# Patient Record
Sex: Male | Born: 1990 | Race: White | Hispanic: No | Marital: Single | Smoking: Never smoker
Health system: Southern US, Community
[De-identification: ages and names within clinical notes are randomized; demographics above are authoritative.]

## PROBLEM LIST (undated history)

## (undated) DIAGNOSIS — K219 Gastro-esophageal reflux disease without esophagitis: Secondary | ICD-10-CM

## (undated) HISTORY — PX: APPENDECTOMY: SHX54

## (undated) HISTORY — DX: Gastro-esophageal reflux disease without esophagitis: K21.9

---

## 2019-03-18 ENCOUNTER — Inpatient Hospital Stay (HOSPITAL_COMMUNITY): Payer: Managed Care, Other (non HMO) | Admitting: Certified Registered Nurse Anesthetist

## 2019-03-18 ENCOUNTER — Encounter (HOSPITAL_BASED_OUTPATIENT_CLINIC_OR_DEPARTMENT_OTHER): Payer: Self-pay

## 2019-03-18 ENCOUNTER — Observation Stay (HOSPITAL_BASED_OUTPATIENT_CLINIC_OR_DEPARTMENT_OTHER)
Admission: EM | Admit: 2019-03-18 | Discharge: 2019-03-19 | Disposition: A | Discharge status: Home / Self Care | Payer: Managed Care, Other (non HMO) | Attending: Surgery | Admitting: Surgery

## 2019-03-18 ENCOUNTER — Encounter (HOSPITAL_COMMUNITY): Admission: EM | Disposition: A | Discharge status: Home / Self Care | Payer: Self-pay | Source: Home / Self Care

## 2019-03-18 ENCOUNTER — Emergency Department (HOSPITAL_BASED_OUTPATIENT_CLINIC_OR_DEPARTMENT_OTHER): Payer: Managed Care, Other (non HMO)

## 2019-03-18 ENCOUNTER — Other Ambulatory Visit: Payer: Self-pay

## 2019-03-18 DIAGNOSIS — K3580 Unspecified acute appendicitis: Secondary | ICD-10-CM | POA: Diagnosis present

## 2019-03-18 DIAGNOSIS — K3533 Acute appendicitis with perforation and localized peritonitis, with abscess: Principal | ICD-10-CM | POA: Insufficient documentation

## 2019-03-18 DIAGNOSIS — Z20828 Contact with and (suspected) exposure to other viral communicable diseases: Secondary | ICD-10-CM | POA: Insufficient documentation

## 2019-03-18 HISTORY — PX: LAPAROSCOPIC APPENDECTOMY: SHX408

## 2019-03-18 LAB — COMPREHENSIVE METABOLIC PANEL WITH GFR
ALT: 32 U/L (ref 0–44)
AST: 29 U/L (ref 15–41)
Albumin: 4.3 g/dL (ref 3.5–5.0)
Alkaline Phosphatase: 72 U/L (ref 38–126)
Anion gap: 10 (ref 5–15)
BUN: 14 mg/dL (ref 6–20)
CO2: 20 mmol/L — ABNORMAL LOW (ref 22–32)
Calcium: 9.5 mg/dL (ref 8.9–10.3)
Chloride: 109 mmol/L (ref 98–111)
Creatinine, Ser: 1.23 mg/dL (ref 0.61–1.24)
GFR calc Af Amer: >60 mL/min
GFR calc non Af Amer: >60 mL/min
Glucose, Bld: 122 mg/dL — ABNORMAL HIGH (ref 70–99)
Potassium: 3.6 mmol/L (ref 3.5–5.1)
Sodium: 139 mmol/L (ref 135–145)
Total Bilirubin: 1.3 mg/dL — ABNORMAL HIGH (ref 0.3–1.2)
Total Protein: 7.8 g/dL (ref 6.5–8.1)

## 2019-03-18 LAB — CBC WITH DIFFERENTIAL/PLATELET
Abs Immature Granulocytes: 0.06 10*3/uL (ref 0.00–0.07)
Basophils Absolute: 0.1 10*3/uL (ref 0.0–0.1)
Basophils Relative: 0 %
Eosinophils Absolute: 0 10*3/uL (ref 0.0–0.5)
Eosinophils Relative: 0 %
HCT: 50.7 % (ref 39.0–52.0)
Hemoglobin: 16.6 g/dL (ref 13.0–17.0)
Immature Granulocytes: 0 %
Lymphocytes Relative: 3 %
Lymphs Abs: 0.6 10*3/uL — ABNORMAL LOW (ref 0.7–4.0)
MCH: 26.9 pg (ref 26.0–34.0)
MCHC: 32.7 g/dL (ref 30.0–36.0)
MCV: 82.3 fL (ref 80.0–100.0)
Monocytes Absolute: 1.4 10*3/uL — ABNORMAL HIGH (ref 0.1–1.0)
Monocytes Relative: 8 %
Neutro Abs: 15 10*3/uL — ABNORMAL HIGH (ref 1.7–7.7)
Neutrophils Relative %: 89 %
Platelets: 353 10*3/uL (ref 150–400)
RBC: 6.16 MIL/uL — ABNORMAL HIGH (ref 4.22–5.81)
RDW: 12.9 % (ref 11.5–15.5)
WBC: 17 10*3/uL — ABNORMAL HIGH (ref 4.0–10.5)
nRBC: 0 % (ref 0.0–0.2)

## 2019-03-18 LAB — URINALYSIS, ROUTINE W REFLEX MICROSCOPIC
Glucose, UA: NEGATIVE mg/dL
Hgb urine dipstick: NEGATIVE
Ketones, ur: 15 mg/dL — AB
Leukocytes,Ua: NEGATIVE
Nitrite: NEGATIVE
Protein, ur: NEGATIVE mg/dL
Specific Gravity, Urine: 1.01 (ref 1.005–1.030)
pH: 6.5 (ref 5.0–8.0)

## 2019-03-18 LAB — LIPASE, BLOOD: Lipase: 19 U/L (ref 11–51)

## 2019-03-18 LAB — SARS CORONAVIRUS 2 BY RT PCR (HOSPITAL ORDER, PERFORMED IN ~~LOC~~ HOSPITAL LAB): SARS Coronavirus 2: NEGATIVE

## 2019-03-18 SURGERY — APPENDECTOMY, LAPAROSCOPIC
Anesthesia: General | Site: Abdomen

## 2019-03-18 MED ORDER — SODIUM CHLORIDE 0.9 % IV SOLN
2.0000 g | Freq: Once | INTRAVENOUS | Status: AC
  Administered 2019-03-18: 2 g via INTRAVENOUS
  Filled 2019-03-18: qty 20

## 2019-03-18 MED ORDER — OXYCODONE HCL 5 MG/5ML PO SOLN: 5.0000 mg | Freq: Once | ORAL | Status: DC | PRN

## 2019-03-18 MED ORDER — DEXAMETHASONE SODIUM PHOSPHATE 10 MG/ML IJ SOLN
INTRAMUSCULAR | Status: DC | PRN
  Administered 2019-03-18: 10 mg via INTRAVENOUS

## 2019-03-18 MED ORDER — SUGAMMADEX SODIUM 500 MG/5ML IV SOLN
INTRAVENOUS | Status: DC | PRN
  Administered 2019-03-18: 400 mg via INTRAVENOUS

## 2019-03-18 MED ORDER — ACETAMINOPHEN 500 MG PO TABS
ORAL_TABLET | ORAL | Status: AC
  Filled 2019-03-18: qty 2

## 2019-03-18 MED ORDER — IOHEXOL 300 MG/ML  SOLN
100.0000 mL | Freq: Once | INTRAMUSCULAR | Status: AC | PRN
  Administered 2019-03-18: 100 mL via INTRAVENOUS

## 2019-03-18 MED ORDER — ROCURONIUM BROMIDE 10 MG/ML (PF) SYRINGE
PREFILLED_SYRINGE | INTRAVENOUS | Status: AC
  Filled 2019-03-18: qty 10

## 2019-03-18 MED ORDER — DEXAMETHASONE SODIUM PHOSPHATE 10 MG/ML IJ SOLN
INTRAMUSCULAR | Status: AC
  Filled 2019-03-18: qty 1

## 2019-03-18 MED ORDER — PROPOFOL 10 MG/ML IV BOLUS
INTRAVENOUS | Status: DC | PRN
  Administered 2019-03-18: 200 mg via INTRAVENOUS

## 2019-03-18 MED ORDER — FENTANYL CITRATE (PF) 250 MCG/5ML IJ SOLN
INTRAMUSCULAR | Status: AC
  Filled 2019-03-18: qty 5

## 2019-03-18 MED ORDER — ONDANSETRON HCL 4 MG/2ML IJ SOLN
4.0000 mg | Freq: Once | INTRAMUSCULAR | Status: AC
  Administered 2019-03-18: 4 mg via INTRAVENOUS
  Filled 2019-03-18: qty 2

## 2019-03-18 MED ORDER — PHENYLEPHRINE 40 MCG/ML (10ML) SYRINGE FOR IV PUSH (FOR BLOOD PRESSURE SUPPORT)
PREFILLED_SYRINGE | INTRAVENOUS | Status: AC
  Filled 2019-03-18: qty 10

## 2019-03-18 MED ORDER — POTASSIUM CHLORIDE 2 MEQ/ML IV SOLN: INTRAVENOUS | Status: DC

## 2019-03-18 MED ORDER — LACTATED RINGERS IV SOLN
INTRAVENOUS | Status: DC
  Administered 2019-03-18 (×2): via INTRAVENOUS

## 2019-03-18 MED ORDER — LIDOCAINE 2% (20 MG/ML) 5 ML SYRINGE
INTRAMUSCULAR | Status: AC
  Filled 2019-03-18: qty 5

## 2019-03-18 MED ORDER — OXYCODONE HCL 5 MG PO TABS
5.0000 mg | ORAL_TABLET | ORAL | Status: DC | PRN
  Administered 2019-03-18 – 2019-03-19 (×2): 5 mg via ORAL
  Administered 2019-03-19: 10 mg via ORAL
  Administered 2019-03-19: 5 mg via ORAL
  Filled 2019-03-18: qty 2
  Filled 2019-03-18 (×2): qty 1
  Filled 2019-03-18: qty 2

## 2019-03-18 MED ORDER — MORPHINE SULFATE (PF) 4 MG/ML IV SOLN
1.0000 mg | INTRAVENOUS | Status: DC | PRN
  Administered 2019-03-18: 2 mg via INTRAVENOUS
  Filled 2019-03-18: qty 1

## 2019-03-18 MED ORDER — SODIUM CHLORIDE 0.9 % IV BOLUS
1000.0000 mL | Freq: Once | INTRAVENOUS | Status: AC
  Administered 2019-03-18: 1000 mL via INTRAVENOUS

## 2019-03-18 MED ORDER — BUPIVACAINE-EPINEPHRINE 0.25% -1:200000 IJ SOLN
INTRAMUSCULAR | Status: DC | PRN
  Administered 2019-03-18: 30 mL

## 2019-03-18 MED ORDER — MORPHINE SULFATE (PF) 4 MG/ML IV SOLN
6.0000 mg | Freq: Once | INTRAVENOUS | Status: AC
  Administered 2019-03-18: 6 mg via INTRAVENOUS
  Filled 2019-03-18: qty 2

## 2019-03-18 MED ORDER — LIDOCAINE 2% (20 MG/ML) 5 ML SYRINGE
INTRAMUSCULAR | Status: DC | PRN
  Administered 2019-03-18: 100 mg via INTRAVENOUS

## 2019-03-18 MED ORDER — BUPIVACAINE-EPINEPHRINE 0.25% -1:200000 IJ SOLN
INTRAMUSCULAR | Status: AC
  Filled 2019-03-18: qty 1

## 2019-03-18 MED ORDER — SUCCINYLCHOLINE CHLORIDE 200 MG/10ML IV SOSY
PREFILLED_SYRINGE | INTRAVENOUS | Status: DC | PRN
  Administered 2019-03-18: 160 mg via INTRAVENOUS

## 2019-03-18 MED ORDER — ONDANSETRON HCL 4 MG/2ML IJ SOLN
INTRAMUSCULAR | Status: DC | PRN
  Administered 2019-03-18: 4 mg via INTRAVENOUS

## 2019-03-18 MED ORDER — PIPERACILLIN-TAZOBACTAM 3.375 G IVPB
3.3750 g | Freq: Three times a day (TID) | INTRAVENOUS | Status: DC
  Administered 2019-03-18 – 2019-03-19 (×3): 3.375 g via INTRAVENOUS
  Filled 2019-03-18 (×3): qty 50

## 2019-03-18 MED ORDER — SUCCINYLCHOLINE CHLORIDE 200 MG/10ML IV SOSY
PREFILLED_SYRINGE | INTRAVENOUS | Status: AC
  Filled 2019-03-18: qty 10

## 2019-03-18 MED ORDER — LACTATED RINGERS IR SOLN
Status: DC | PRN
  Administered 2019-03-18: 1000 mL

## 2019-03-18 MED ORDER — ACETAMINOPHEN 500 MG PO TABS
1000.0000 mg | ORAL_TABLET | Freq: Four times a day (QID) | ORAL | Status: DC
  Administered 2019-03-18 – 2019-03-19 (×5): 1000 mg via ORAL
  Filled 2019-03-18 (×4): qty 2

## 2019-03-18 MED ORDER — ONDANSETRON HCL 4 MG/2ML IJ SOLN: 4.0000 mg | INTRAMUSCULAR | Status: DC | PRN

## 2019-03-18 MED ORDER — MIDAZOLAM HCL 5 MG/5ML IJ SOLN
INTRAMUSCULAR | Status: DC | PRN
  Administered 2019-03-18: 2 mg via INTRAVENOUS

## 2019-03-18 MED ORDER — ONDANSETRON HCL 4 MG/2ML IJ SOLN
INTRAMUSCULAR | Status: AC
  Filled 2019-03-18: qty 2

## 2019-03-18 MED ORDER — OXYCODONE HCL 5 MG PO TABS: 5.0000 mg | ORAL_TABLET | Freq: Once | ORAL | Status: DC | PRN

## 2019-03-18 MED ORDER — FENTANYL CITRATE (PF) 100 MCG/2ML IJ SOLN
INTRAMUSCULAR | Status: DC | PRN
  Administered 2019-03-18 (×5): 50 ug via INTRAVENOUS

## 2019-03-18 MED ORDER — MIDAZOLAM HCL 2 MG/2ML IJ SOLN
INTRAMUSCULAR | Status: AC
  Filled 2019-03-18: qty 2

## 2019-03-18 MED ORDER — PHENYLEPHRINE 40 MCG/ML (10ML) SYRINGE FOR IV PUSH (FOR BLOOD PRESSURE SUPPORT)
PREFILLED_SYRINGE | INTRAVENOUS | Status: DC | PRN
  Administered 2019-03-18: 80 ug via INTRAVENOUS

## 2019-03-18 MED ORDER — KCL IN DEXTROSE-NACL 20-5-0.45 MEQ/L-%-% IV SOLN
INTRAVENOUS | Status: DC
  Administered 2019-03-18: 21:00:00 via INTRAVENOUS
  Filled 2019-03-18 (×3): qty 1000

## 2019-03-18 MED ORDER — SODIUM CHLORIDE 0.9 % IV SOLN: Freq: Once | INTRAVENOUS | Status: DC

## 2019-03-18 MED ORDER — ONDANSETRON HCL 4 MG/2ML IJ SOLN: 4.0000 mg | Freq: Once | INTRAMUSCULAR | Status: DC | PRN

## 2019-03-18 MED ORDER — FENTANYL CITRATE (PF) 100 MCG/2ML IJ SOLN: 25.0000 ug | INTRAMUSCULAR | Status: DC | PRN

## 2019-03-18 MED ORDER — PROPOFOL 10 MG/ML IV BOLUS
INTRAVENOUS | Status: AC
  Filled 2019-03-18: qty 20

## 2019-03-18 MED ORDER — ROCURONIUM BROMIDE 50 MG/5ML IV SOSY
PREFILLED_SYRINGE | INTRAVENOUS | Status: DC | PRN
  Administered 2019-03-18: 10 mg via INTRAVENOUS
  Administered 2019-03-18: 50 mg via INTRAVENOUS
  Administered 2019-03-18: 20 mg via INTRAVENOUS

## 2019-03-18 MED ORDER — METRONIDAZOLE IN NACL 5-0.79 MG/ML-% IV SOLN
500.0000 mg | Freq: Once | INTRAVENOUS | Status: AC
  Administered 2019-03-18: 500 mg via INTRAVENOUS
  Filled 2019-03-18: qty 100

## 2019-03-18 MED ORDER — SUGAMMADEX SODIUM 500 MG/5ML IV SOLN
INTRAVENOUS | Status: AC
  Filled 2019-03-18: qty 5

## 2019-03-18 SURGICAL SUPPLY — 44 items
APPLIER CLIP ROT 10 11.4 M/L (STAPLE)
BENZOIN TINCTURE PRP APPL 2/3 (GAUZE/BANDAGES/DRESSINGS) IMPLANT
CABLE HIGH FREQUENCY MONO STRZ (ELECTRODE) IMPLANT
CHLORAPREP W/TINT 26 (MISCELLANEOUS) ×3 IMPLANT
CLIP APPLIE ROT 10 11.4 M/L (STAPLE) IMPLANT
CLOSURE WOUND 1/2 X4 (GAUZE/BANDAGES/DRESSINGS)
COVER SURGICAL LIGHT HANDLE (MISCELLANEOUS) ×3 IMPLANT
COVER WAND RF STERILE (DRAPES) IMPLANT
CUTTER FLEX LINEAR 45M (STAPLE) ×6 IMPLANT
DECANTER SPIKE VIAL GLASS SM (MISCELLANEOUS) ×3 IMPLANT
DERMABOND ADVANCED (GAUZE/BANDAGES/DRESSINGS) ×2
DERMABOND ADVANCED .7 DNX12 (GAUZE/BANDAGES/DRESSINGS) ×1 IMPLANT
ELECT REM PT RETURN 15FT ADLT (MISCELLANEOUS) ×3 IMPLANT
ENDOLOOP SUT PDS II  0 18 (SUTURE)
ENDOLOOP SUT PDS II 0 18 (SUTURE) IMPLANT
GLOVE SURG SYN 7.5  E (GLOVE) ×2
GLOVE SURG SYN 7.5 E (GLOVE) ×1 IMPLANT
GOWN STRL REUS W/TWL XL LVL3 (GOWN DISPOSABLE) ×6 IMPLANT
GRASPER SUT TROCAR 14GX15 (MISCELLANEOUS) ×3 IMPLANT
KIT BASIN OR (CUSTOM PROCEDURE TRAY) ×3 IMPLANT
KIT TURNOVER KIT A (KITS) IMPLANT
PENCIL SMOKE EVACUATOR (MISCELLANEOUS) IMPLANT
POUCH RETRIEVAL ECOSAC 10 (ENDOMECHANICALS) ×1 IMPLANT
POUCH RETRIEVAL ECOSAC 10MM (ENDOMECHANICALS) ×2
POUCH SPECIMEN RETRIEVAL 10MM (ENDOMECHANICALS) IMPLANT
RELOAD 45 THICK GREEN (ENDOMECHANICALS) ×3 IMPLANT
RELOAD 45 VASCULAR/THIN (ENDOMECHANICALS) IMPLANT
RELOAD STAPLE TA45 3.5 REG BLU (ENDOMECHANICALS) IMPLANT
SCISSORS LAP 5X35 DISP (ENDOMECHANICALS) ×3 IMPLANT
SET IRRIG TUBING LAPAROSCOPIC (IRRIGATION / IRRIGATOR) ×3 IMPLANT
SET TUBE SMOKE EVAC HIGH FLOW (TUBING) ×3 IMPLANT
SHEARS HARMONIC ACE PLUS 36CM (ENDOMECHANICALS) ×3 IMPLANT
SHEARS HARMONIC ACE PLUS 45CM (MISCELLANEOUS) ×3 IMPLANT
SLEEVE XCEL OPT CAN 5 100 (ENDOMECHANICALS) ×3 IMPLANT
STRIP CLOSURE SKIN 1/2X4 (GAUZE/BANDAGES/DRESSINGS) IMPLANT
SUT MNCRL AB 4-0 PS2 18 (SUTURE) ×3 IMPLANT
SUT VIC AB 2-0 CT1 27 (SUTURE) ×2
SUT VIC AB 2-0 CT1 TAPERPNT 27 (SUTURE) ×1 IMPLANT
SUT VIC AB 2-0 SH 18 (SUTURE) IMPLANT
TOWEL OR 17X26 10 PK STRL BLUE (TOWEL DISPOSABLE) ×3 IMPLANT
TOWEL OR NON WOVEN STRL DISP B (DISPOSABLE) ×3 IMPLANT
TRAY LAPAROSCOPIC (CUSTOM PROCEDURE TRAY) ×3 IMPLANT
TROCAR BLADELESS OPT 5 100 (ENDOMECHANICALS) ×3 IMPLANT
TROCAR XCEL BLUNT TIP 100MML (ENDOMECHANICALS) ×3 IMPLANT

## 2019-03-18 NOTE — ED Triage Notes (Signed)
Pt states began with vomiting 4 days ago, not able to hold much down po, began having right sided abdominal pain 4am this morning.  Seen by pmd today, sent here for further eval/CT scan.

## 2019-03-18 NOTE — H&P (Addendum)
Jolene SchimkeJoshua Burbage 04-30-1990  [id number].    Chief Complaint/Reason for Consult: Acute appendicitis  HPI:  This is a 28 year old white male with no past medical history who began having intermittent nausea and vomiting on Monday.  By Tuesday this had resolved and then once again recurred on Wednesday.  It resolved again on Thursday but then returned this morning along with right sided abdominal pain.  He denies any diarrhea.  He denies any fevers at home but currently has a fever of 102 here in the emergency room.  Nothing made his pain better or worse.  Due to persistent pain he presented to the emergency department where he was evaluated and found to have acute appendicitis on the CT scan.  He was transferred from [facility].  ROS: ROS: Please see HPI otherwise all other systems have been reviewed and are negative.  He denies any Covid symptoms or Covid exposure.  Covid test is negative  History reviewed. No pertinent family history.  History reviewed. No pertinent past medical history.  History reviewed. No pertinent surgical history.  Social History:  reports that he has never smoked. He has never used smokeless tobacco. He reports current alcohol use. He reports current drug use. Drug: Marijuana.  Allergies: No Known Allergies  Medications Prior to Admission  Medication Sig Dispense Refill  . promethazine (PHENERGAN) 25 MG/ML injection Inject into the muscle.       Physical Exam: Blood pressure 126/88, pulse (!) 115, temperature (!) 102.6 F (39.2 C), temperature source Oral, resp. rate 18, height 5\' 11"  (1.803 m), weight 116.1 kg, SpO2 98 %. General: pleasant, WD, WN white male who is laying in bed in NAD HEENT: head is normocephalic, atraumatic.  Sclera are noninjected.  PERRL.  Ears and nose without any masses or lesions.  Mouth is pink and moist Heart: regular, rate, and rhythm.  Normal s1,s2. No obvious murmurs,  gallops, or rubs noted.  Palpable radial and pedal pulses bilaterally Lungs: CTAB, no wheezes, rhonchi, or rales noted.  Respiratory effort nonlabored Abd: soft, mild diffuse tenderness but greatest pain is in the right lower quadrant at McBurney's point., obese, hypoactive BS, no masses, hernias, or organomegaly MS: all 4 extremities are symmetrical with no cyanosis, clubbing, or edema. Skin: warm and dry with no masses, lesions, or rashes Psych: A&Ox3 with an appropriate affect.   Results for orders placed or performed during the hospital encounter of 03/18/19 (from the past 48 hour(s))  CBC with Differential     Status: Abnormal   Collection Time: 03/18/19 11:04 AM  Result Value Ref Range   WBC 17.0 (H) 4.0 - 10.5 K/uL   RBC 6.16 (H) 4.22 - 5.81 MIL/uL   Hemoglobin 16.6 13.0 - 17.0 g/dL   HCT 56.250.7 13.039.0 - 86.552.0 %   MCV 82.3 80.0 - 100.0 fL   MCH 26.9 26.0 - 34.0 pg   MCHC 32.7 30.0 - 36.0 g/dL   RDW 78.412.9 69.611.5 - 29.515.5 %   Platelets 353 150 - 400 K/uL   nRBC 0.0 0.0 - 0.2 %   Neutrophils Relative % 89 %   Neutro Abs 15.0 (H) 1.7 - 7.7 K/uL   Lymphocytes Relative 3 %   Lymphs Abs 0.6 (L) 0.7 - 4.0 K/uL   Monocytes Relative 8 %   Monocytes Absolute 1.4 (H) 0.1 - 1.0 K/uL   Eosinophils Relative 0 %   Eosinophils Absolute 0.0 0.0 - 0.5 K/uL  Basophils Relative 0 %   Basophils Absolute 0.1 0.0 - 0.1 K/uL   Immature Granulocytes 0 %   Abs Immature Granulocytes 0.06 0.00 - 0.07 K/uL    Comment: Performed at [facility], [address] [place], [place] [address]  Comprehensive metabolic panel     Status: Abnormal   Collection Time: 03/18/19 11:04 AM  Result Value Ref Range   Sodium 139 135 - 145 mmol/L   Potassium 3.6 3.5 - 5.1 mmol/L   Chloride 109 98 - 111 mmol/L   CO2 20 (L) 22 - 32 mmol/L   Glucose, Bld 122 (H) 70 - 99 mg/dL   BUN 14 6 - 20 mg/dL   Creatinine, Ser 1.23 0.61 - 1.24 mg/dL   Calcium 9.5 8.9 - 10.3 mg/dL   Total Protein 7.8 6.5 - 8.1 g/dL   Albumin  4.3 3.5 - 5.0 g/dL   AST 29 15 - 41 U/L   ALT 32 0 - 44 U/L   Alkaline Phosphatase 72 38 - 126 U/L   Total Bilirubin 1.3 (H) 0.3 - 1.2 mg/dL   GFR calc non Af Amer >60 >60 mL/min   GFR calc Af Amer >60 >60 mL/min   Anion gap 10 5 - 15    Comment: Performed at [facility], [address] [place], [place] [address]  Lipase, blood     Status: None   Collection Time: 03/18/19 11:04 AM  Result Value Ref Range   Lipase 19 11 - 51 U/L    Comment: Performed at [facility], [address] [place], [place] [address]  Urinalysis, Routine w reflex microscopic     Status: Abnormal   Collection Time: 03/18/19 11:24 AM  Result Value Ref Range   Color, Urine AMBER (A) YELLOW    Comment: BIOCHEMICALS MAY BE AFFECTED BY COLOR   APPearance CLEAR CLEAR   Specific Gravity, Urine 1.010 1.005 - 1.030   pH 6.5 5.0 - 8.0   Glucose, UA NEGATIVE NEGATIVE mg/dL   Hgb urine dipstick NEGATIVE NEGATIVE   Bilirubin Urine SMALL (A) NEGATIVE   Ketones, ur 15 (A) NEGATIVE mg/dL   Protein, ur NEGATIVE NEGATIVE mg/dL   Nitrite NEGATIVE NEGATIVE   Leukocytes,Ua NEGATIVE NEGATIVE    Comment: Microscopic not done on urines with negative protein, blood, leukocytes, nitrite, or glucose < 500 mg/dL. Performed at [facility], [address] [place], [place] [address]   SARS Coronavirus 2 by RT PCR (hospital order, performed in [facility] hospital lab) Nasopharyngeal Nasopharyngeal Swab     Status: None   Collection Time: 03/18/19 12:29 PM   Specimen: Nasopharyngeal Swab  Result Value Ref Range   SARS Coronavirus 2 NEGATIVE NEGATIVE    Comment: Performed at [facility], [address] [place], [place] [address]   Ct Abdomen Pelvis W Contrast  Result Date: 03/18/2019 CLINICAL DATA:  Vomiting beginning 4 days ago. Right-sided abdominal pain since 4 a.m. this morning EXAM: CT ABDOMEN AND PELVIS WITH CONTRAST TECHNIQUE: Multidetector CT imaging of the abdomen and pelvis  was performed using the standard protocol following bolus administration of intravenous contrast. CONTRAST:  170mL OMNIPAQUE IOHEXOL 300 MG/ML  SOLN COMPARISON:  None. FINDINGS: Lower chest: Lung bases essentially clear.  Heart normal in size. Hepatobiliary: No focal liver abnormality is seen. No gallstones, gallbladder wall thickening, or biliary dilatation. Pancreas: Unremarkable. No pancreatic ductal dilatation or surrounding inflammatory changes. Spleen: Normal in size  without focal abnormality. Adrenals/Urinary Tract: Adrenal glands are unremarkable. Kidneys are normal, without renal calculi, focal lesion, or hydronephrosis. Bladder is unremarkable. Stomach/Bowel: Appendix is dilated to 1 cm with wall thickening and adjacent inflammation. There is adjacent inflammatory stranding. No extraluminal air or adjacent fluid collection. Stomach is unremarkable. Small bowel and colon are normal in caliber. No wall thickening. No inflammation. Vascular/Lymphatic: No significant vascular findings are present. No enlarged abdominal or pelvic lymph nodes. Reproductive: Unremarkable Other: Small amount of pelvic free fluid.  No hernia. Musculoskeletal: Well-defined lucent lesion in the intertrochanteric region of the right proximal femur with a smooth thin sclerotic margin, consistent with bone cyst. No other bone lesions. No fracture or acute finding. IMPRESSION: 1. Acute appendicitis. No evidence appendiceal rupture or of a periappendiceal abscess. 2. No other acute abnormality. 3. 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 4. No other abnormalities. Electronically Signed   By: Amie Portland M.D.   On: 03/18/2019 11:30      Assessment/Plan Acute appendicitis The patient will be admitted and taken to the operating room tonight for laparoscopic appendectomy.  He has been given Rocephin and Flagyl in the emergency department.  He will be given Tylenol currently for his fever of 102.7.   The patient is otherwise been n.p.o. and is ready for surgical intervention. I have discussed the procedure and risks of appendectomy. The risks include but are not limited to bleeding, infection, wound problems, anesthesia, injury to intra-abdominal organs, possibility of postoperative ileus. He seems to understand and agrees with the plan.  I have also discussed the expected postoperative recovery course.  FEN - NPO VTE - hold until post op ID - Rocephin/Flagyl  Letha Cape, [facility] Surgery 03/18/2019, 4:10 PM Please see Amion for pager number during day hours 7:00am-4:30pm  Agree with above.  I discussed with the patient the indications and risks of appendiceal surgery.  The primary risks of appendiceal surgery include, but are not limited to, bleeding, infection, bowel surgery, and open surgery.  There is also the risk that the patient may have continued symptoms after surgery.  We discussed the typical post-operative recovery course. I tried to answer the patient's questions.  He is from [place].  I will talk to his father post op.  He is single.  He works for [employer] as a Nurse, adult.  Ovidio Kin, MD, [facility] Surgery Office phone:  [phone]

## 2019-03-18 NOTE — Anesthesia Preprocedure Evaluation (Addendum)
Anesthesia Evaluation  Patient identified by MRN, date of birth, ID band Patient awake    Reviewed: Allergy & Precautions, NPO status , Patient's Chart, lab work & pertinent test results  Airway Mallampati: II  TM Distance: >3 FB Neck ROM: Full    Dental  (+) Teeth Intact, Dental Advisory Given   Pulmonary    breath sounds clear to auscultation       Cardiovascular  Rhythm:Regular Rate:Normal     Neuro/Psych    GI/Hepatic   Endo/Other    Renal/GU      Musculoskeletal   Abdominal   Peds  Hematology   Anesthesia Other Findings   Reproductive/Obstetrics                            Anesthesia Physical Anesthesia Plan  ASA: II and emergent  Anesthesia Plan: General   Post-op Pain Management:    Induction: Rapid sequence and Cricoid pressure planned  PONV Risk Score and Plan: Ondansetron and Dexamethasone  Airway Management Planned: Oral ETT  Additional Equipment:   Intra-op Plan:   Post-operative Plan: Extubation in OR  Informed Consent: I have reviewed the patients History and Physical, chart, labs and discussed the procedure including the risks, benefits and alternatives for the proposed anesthesia with the patient or authorized representative who has indicated his/her understanding and acceptance.     Dental advisory given  Plan Discussed with: CRNA and Anesthesiologist  Anesthesia Plan Comments:         Anesthesia Quick Evaluation

## 2019-03-18 NOTE — Transfer of Care (Signed)
Immediate Anesthesia Transfer of Care Note  Patient: Nicholas Maynard  Procedure(s) Performed: APPENDECTOMY LAPAROSCOPIC (N/A Abdomen)  Patient Location: PACU  Anesthesia Type:General  Level of Consciousness: awake, alert  and patient cooperative  Airway & Oxygen Therapy: Patient Spontanous Breathing and Patient connected to face mask oxygen  Post-op Assessment: Report given to RN and Post -op Vital signs reviewed and stable  Post vital signs: Reviewed and stable  Last Vitals:  Vitals Value Taken Time  BP 141/86 03/18/19 1805  Temp    Pulse 108 03/18/19 1808  Resp    SpO2 97 % 03/18/19 1808  Vitals shown include unvalidated device data.  Last Pain:  Vitals:   03/18/19 1506  TempSrc:   PainSc: 3       Patients Stated Pain Goal: 3 (94/76/54 [id number])  Complications: No apparent anesthesia complications

## 2019-03-18 NOTE — Anesthesia Postprocedure Evaluation (Signed)
Anesthesia Post Note  Patient: Nicholas Maynard  Procedure(s) Performed: APPENDECTOMY LAPAROSCOPIC (N/A Abdomen)     Patient location during evaluation: PACU Anesthesia Type: General Level of consciousness: awake, oriented and sedated Pain management: pain level controlled Vital Signs Assessment: post-procedure vital signs reviewed and stable Respiratory status: spontaneous breathing, nonlabored ventilation, respiratory function stable and patient connected to nasal cannula oxygen Cardiovascular status: blood pressure returned to baseline and stable Postop Assessment: no apparent nausea or vomiting Anesthetic complications: no    Last Vitals:  Vitals:   03/18/19 1456 03/18/19 1805  BP: 126/88 (!) 141/86  Pulse: (!) 115 (!) 109  Resp: 18 15  Temp: (!) 39.2 C 37.6 C  SpO2: 98% 97%    Last Pain:  Vitals:   03/18/19 1506  TempSrc:   PainSc: 3                  Nicholas Maynard

## 2019-03-18 NOTE — ED Notes (Signed)
Report given to [facility] with short stay pre-op, at [facility]

## 2019-03-18 NOTE — ED Provider Notes (Signed)
[facility] EMERGENCY DEPARTMENT Provider Note   CSN: [id number] Arrival date & time: 03/18/19  1023     History   Chief Complaint Chief Complaint  Patient presents with   Abdominal Pain   Nausea    HPI Nicholas Maynard is a 28 y.o. male.     The history is provided by the patient.  Abdominal Pain Pain location:  Generalized (worse in RLQ) Pain quality: aching   Pain radiates to:  Does not radiate Pain severity:  Mild Onset quality:  Gradual Timing:  Constant Progression:  Worsening Chronicity:  New Context: not previous surgeries   Relieved by:  Nothing Worsened by:  Nothing Associated symptoms: nausea and vomiting   Associated symptoms: no chest pain, no chills, no cough, no dysuria, no fever, no hematuria, no shortness of breath and no sore throat   Risk factors: has not had multiple surgeries     History reviewed. No pertinent past medical history.  There are no active problems to display for this patient.       Home Medications    Prior to Admission medications   Medication Sig Start Date End Date Taking? Authorizing Provider  promethazine (PHENERGAN) 25 MG/ML injection Inject into the muscle. 03/18/19 03/18/19  [provider]    Family History History reviewed. No pertinent family history.  Social History Social History   Tobacco Use   Smoking status: Not on file  Substance Use Topics   Alcohol use: Not on file   Drug use: Not on file     Allergies   Patient has no known allergies.   Review of Systems Review of Systems  Constitutional: Negative for chills and fever.  HENT: Negative for ear pain and sore throat.   Eyes: Negative for pain and visual disturbance.  Respiratory: Negative for cough and shortness of breath.   Cardiovascular: Negative for chest pain and palpitations.  Gastrointestinal: Positive for abdominal pain, nausea and vomiting.  Genitourinary: Negative for dysuria and hematuria.   Musculoskeletal: Negative for arthralgias and back pain.  Skin: Negative for color change and rash.  Neurological: Negative for seizures and syncope.  All other systems reviewed and are negative.    Physical Exam Updated Vital Signs BP 140/89    Pulse (!) 104    Temp 99.4 F (37.4 C) (Oral)    Resp 18    Ht 5\' 11"  (1.803 m)    Wt 116.1 kg    SpO2 98%    BMI 35.70 kg/m   Physical Exam Vitals signs and nursing note reviewed.  Constitutional:      General: He is not in acute distress.    Appearance: He is well-developed. He is not ill-appearing.  HENT:     Head: Normocephalic and atraumatic.     Mouth/Throat:     Mouth: Mucous membranes are moist.  Eyes:     Extraocular Movements: Extraocular movements intact.     Conjunctiva/sclera: Conjunctivae normal.  Neck:     Musculoskeletal: Neck supple.  Cardiovascular:     Rate and Rhythm: Regular rhythm. Tachycardia present.     Heart sounds: Normal heart sounds. No murmur.  Pulmonary:     Effort: Pulmonary effort is normal. No respiratory distress.     Breath sounds: Normal breath sounds.  Abdominal:     Palpations: Abdomen is soft.     Tenderness: There is generalized abdominal tenderness and tenderness in the right lower quadrant. There is guarding and rebound. Positive signs include McBurney's sign. Negative  signs include Rovsing's sign and psoas sign.  Skin:    General: Skin is warm and dry.     Capillary Refill: Capillary refill takes less than 2 seconds.  Neurological:     General: No focal deficit present.     Mental Status: He is alert.  Psychiatric:        Mood and Affect: Mood normal.      ED Treatments / Results  Labs (all labs ordered are listed, but only abnormal results are displayed) Labs Reviewed  CBC WITH DIFFERENTIAL/PLATELET - Abnormal; Notable for the following components:      Result Value   WBC 17.0 (*)    RBC 6.16 (*)    Neutro Abs 15.0 (*)    Lymphs Abs 0.6 (*)    Monocytes Absolute 1.4 (*)     All other components within normal limits  COMPREHENSIVE METABOLIC PANEL - Abnormal; Notable for the following components:   CO2 20 (*)    Glucose, Bld 122 (*)    Total Bilirubin 1.3 (*)    All other components within normal limits  URINALYSIS, ROUTINE W REFLEX MICROSCOPIC - Abnormal; Notable for the following components:   Color, Urine AMBER (*)    Bilirubin Urine SMALL (*)    Ketones, ur 15 (*)    All other components within normal limits  SARS CORONAVIRUS 2 BY RT PCR (HOSPITAL ORDER, PERFORMED IN [facility] LAB)  LIPASE, BLOOD    EKG None  Radiology Ct Abdomen Pelvis W Contrast  Result Date: 03/18/2019 CLINICAL DATA:  Vomiting beginning 4 days ago. Right-sided abdominal pain since 4 a.m. this morning EXAM: CT ABDOMEN AND PELVIS WITH CONTRAST TECHNIQUE: Multidetector CT imaging of the abdomen and pelvis was performed using the standard protocol following bolus administration of intravenous contrast. CONTRAST:  100mL OMNIPAQUE IOHEXOL 300 MG/ML  SOLN COMPARISON:  None. FINDINGS: Lower chest: Lung bases essentially clear.  Heart normal in size. Hepatobiliary: No focal liver abnormality is seen. No gallstones, gallbladder wall thickening, or biliary dilatation. Pancreas: Unremarkable. No pancreatic ductal dilatation or surrounding inflammatory changes. Spleen: Normal in size without focal abnormality. Adrenals/Urinary Tract: Adrenal glands are unremarkable. Kidneys are normal, without renal calculi, focal lesion, or hydronephrosis. Bladder is unremarkable. Stomach/Bowel: Appendix is dilated to 1 cm with wall thickening and adjacent inflammation. There is adjacent inflammatory stranding. No extraluminal air or adjacent fluid collection. Stomach is unremarkable. Small bowel and colon are normal in caliber. No wall thickening. No inflammation. Vascular/Lymphatic: No significant vascular findings are present. No enlarged abdominal or pelvic lymph nodes. Reproductive: Unremarkable Other:  Small amount of pelvic free fluid.  No hernia. Musculoskeletal: Well-defined lucent lesion in the intertrochanteric region of the right proximal femur with a smooth thin sclerotic margin, consistent with bone cyst. No other bone lesions. No fracture or acute finding. IMPRESSION: 1. Acute appendicitis. No evidence appendiceal rupture or of a periappendiceal abscess. 2. No other acute abnormality. 3. 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 4. No other abnormalities. Electronically Signed   By: Amie Portlandavid  Ormond M.D.   On: 03/18/2019 11:30    Procedures Procedures (including critical care time)  Medications Ordered in ED Medications  0.9 %  sodium chloride infusion (has no administration in time range)  cefTRIAXone (ROCEPHIN) 2 g in sodium chloride 0.9 % 100 mL IVPB (has no administration in time range)  metroNIDAZOLE (FLAGYL) IVPB 500 mg (500 mg Intravenous New Bag/Given 03/18/19 1232)  morphine 4 MG/ML injection 6 mg (6 mg Intravenous  Given 03/18/19 1124)  sodium chloride 0.9 % bolus 1,000 mL (1,000 mLs Intravenous New Bag/Given 03/18/19 1109)  ondansetron (ZOFRAN) injection 4 mg (4 mg Intravenous Given 03/18/19 1124)  iohexol (OMNIPAQUE) 300 MG/ML solution 100 mL (100 mLs Intravenous Contrast Given 03/18/19 1112)     Initial Impression / Assessment and Plan / ED Course  I have reviewed the triage vital signs and the nursing notes.  Pertinent labs & imaging results that were available during my care of the patient were reviewed by me and considered in my medical decision making (see chart for details).        Nicholas Maynard is a 28 year old male with no significant medical history who presents to the ED with lower abdominal pain with nausea and vomiting.  Patient with overall unremarkable vitals.  Mild tachycardia.  Patient has had some vomiting intermittently over the last 4 days but began having right-sided abdominal pain early this morning.  Has tenderness  throughout on abdominal exam but mostly tender in the right lower quadrant.  Concern for appendicitis.  Possibly viral gastroenteritis.  Does not have any urinary symptoms.  Will get IV fluids, IV morphine, IV nausea medicine, basic labs and CT scan abdomen and pelvis.  Low concern for bowel obstruction.  No history abdominal surgeries.  Patient with acute appendicitis on CT scan.  Overall uncomplicated.  Has a white count of 17 but otherwise unremarkable labs.  Talked with Claiborne Billings, [place] from general surgery team.  They recommend transfer to [facility] emergency department and they will admit from there or take to the OR from the ED.  Dr. Zenia Resides was made aware of the patient being transferred to the emergency department at [facility].  They will page surgery upon arrival of the patient.  Rapid Covid has been ordered.  Patient hemodynamically stable throughout my care.  Was started on maintenance fluids.  Given IV Flagyl and IV Rocephin for surgical prophylaxis.  This chart was dictated using voice recognition software.  Despite best efforts to proofread,  errors can occur which can change the documentation meaning.    Final Clinical Impressions(s) / ED Diagnoses   Final diagnoses:  Acute appendicitis, unspecified acute appendicitis type    ED Discharge Orders    None       Lennice Sites, DO 03/18/19 1252

## 2019-03-18 NOTE — ED Notes (Signed)
Report given to Shannon RN with [facility] 

## 2019-03-18 NOTE — Op Note (Signed)
Re:   Nicholas Maynard DOB:   1990/06/15 MRN:   [id number]                   FACILITY:  [facility]  DATE OF PROCEDURE: 03/18/2019                              OPERATIVE REPORT  PREOPERATIVE DIAGNOSIS:  Appendicitis  POSTOPERATIVE DIAGNOSIS:  Acute focally ruptured  appendicitis.  PROCEDURE:  Laparoscopic appendectomy.  SURGEON:  Sandria Bales. Ezzard Standing, MD  ASSISTANT:  No first assistant.  ANESTHESIA:  General endotracheal.  Anesthesiologist: Kipp Brood, MD CRNA: Wynonia Sours, CRNA  ASA:  2 and emergent  ESTIMATED BLOOD LOSS:  Minimal.  DRAINS: none   SPECIMEN:   Appendix  COUNTS CORRECT:  YES  INDICATIONS FOR PROCEDURE: Nicholas Maynard is a 28 y.o. (DOB: Dec 01, 1990) white male whose primary care doctor is Patient, No Pcp Per and comes to the OR for an appendectomy.   I discussed with the patient, the indications and potential complications of appendiceal surgery.  The potential complications include, but are not limited to, bleeding, open surgery, bowel resection, and the possibility of another diagnosis.  OPERATIVE NOTE:  The patient underwent a general endotracheal anesthetic as supervised by Anesthesiologist: Kipp Brood, MD CRNA: Wynonia Sours, CRNA, General, in OR room #4.  The patient was given Rocephin and Flagyl prior to the beginning of the procedure and the abdomen was prepped with ChloraPrep.   A time-out was held and surgical checklist run.  An infraumbilical incision was made with sharp dissection carried down to the abdominal cavity.  An 12 mm Hasson trocar was inserted through the infraumbilical incision and into the peritoneal cavity.  A 30 degree 5 mm laparoscope was inserted through a 12 mm Hasson trocar and the Hasson trocar secured with a 0 Vicryl suture.  I placed a 5 mm trocar in the right upper quadrant and a 5 mm torcar in left lower quadrant and did abdominal exploration.    The right and left lobes of liver unremarkable.  Stomach was unremarkable.  The  pelvic organs were unremarkable.  I saw no other intra-abdominal abnormality.  The patient had appendicitis with the appendix located in the right lower quadrant.  The appendix was focally perforated with limited containation.  The mesentery of the appendix was divided with a Harmonic scalpel.  I got to the base of the appendix.  I then used a green load 45 mm Ethicon Endo-GIA stapler (the base of the appendix was thickened) and fired this across the base of the appendix.  I placed the appendix in Eco Sac bag and delivered the bag through the umbilical incision.  I irrigated the abdomen with 1,000 cc of saline.  After irrigating the abdomen, I then removed the trocars, in turn.  The umbilical port fascia was closed with 0 Vicryl suture.  I did a second 2-0 Vicryl with an endoclose.  I closed the skin each site with a 4-0 Monocryl suture and painted the wounds with DermaBond.  I then injected a total of 30 mL of 0.25% Marcaine at the incisions.  Sponge and needle count were correct at the end of the case.  The patient was transferred to the recovery room in good condition.  The patient tolerated the procedure well and it depends on the patient's post op clinical course as to when the patient could be discharged.   Onalee Hua  Lucia Gaskins, MD, [facility] Surgery Pager: [phone] Office phone:  [phone]

## 2019-03-18 NOTE — ED Notes (Signed)
Patient transported to CT 

## 2019-03-18 NOTE — ED Notes (Signed)
All valuables kept with pt, wallet, cellphone, jeans, shoes, shirt

## 2019-03-18 NOTE — Anesthesia Procedure Notes (Addendum)
Procedure Name: Intubation Date/Time: 03/18/2019 4:47 PM Performed by: West Pugh, CRNA Pre-anesthesia Checklist: Patient identified, Emergency Drugs available, Suction available, Patient being monitored and Timeout performed Patient Re-evaluated:Patient Re-evaluated prior to induction Oxygen Delivery Method: Circle system utilized Preoxygenation: Pre-oxygenation with 100% oxygen Induction Type: IV induction, Rapid sequence and Cricoid Pressure applied Laryngoscope Size: Mac and 3 Grade View: Grade I Tube type: Oral Tube size: 7.5 mm Number of attempts: 1 Airway Equipment and Method: Stylet Placement Confirmation: ETT inserted through vocal cords under direct vision,  positive ETCO2,  CO2 detector and breath sounds checked- equal and bilateral Secured at: 21 cm Tube secured with: Tape Dental Injury: Teeth and Oropharynx as per pre-operative assessment

## 2019-03-19 ENCOUNTER — Encounter (HOSPITAL_COMMUNITY): Payer: Self-pay | Admitting: Surgery

## 2019-03-19 LAB — CBC WITH DIFFERENTIAL/PLATELET
Abs Immature Granulocytes: 0.12 10*3/uL — ABNORMAL HIGH (ref 0.00–0.07)
Basophils Absolute: 0 10*3/uL (ref 0.0–0.1)
Basophils Relative: 0 %
Eosinophils Absolute: 0 10*3/uL (ref 0.0–0.5)
Eosinophils Relative: 0 %
HCT: 42.2 % (ref 39.0–52.0)
Hemoglobin: 13.3 g/dL (ref 13.0–17.0)
Immature Granulocytes: 1 %
Lymphocytes Relative: 5 %
Lymphs Abs: 0.9 10*3/uL (ref 0.7–4.0)
MCH: 27.1 pg (ref 26.0–34.0)
MCHC: 31.5 g/dL (ref 30.0–36.0)
MCV: 86.1 fL (ref 80.0–100.0)
Monocytes Absolute: 1.3 10*3/uL — ABNORMAL HIGH (ref 0.1–1.0)
Monocytes Relative: 7 %
Neutro Abs: 16.3 10*3/uL — ABNORMAL HIGH (ref 1.7–7.7)
Neutrophils Relative %: 87 %
Platelets: 279 10*3/uL (ref 150–400)
RBC: 4.9 MIL/uL (ref 4.22–5.81)
RDW: 13.2 % (ref 11.5–15.5)
WBC: 18.7 10*3/uL — ABNORMAL HIGH (ref 4.0–10.5)
nRBC: 0 % (ref 0.0–0.2)

## 2019-03-19 MED ORDER — LIP MEDEX EX OINT
TOPICAL_OINTMENT | CUTANEOUS | Status: DC | PRN
  Filled 2019-03-19: qty 7

## 2019-03-19 MED ORDER — ENOXAPARIN SODIUM 40 MG/0.4ML ~~LOC~~ SOLN
40.0000 mg | SUBCUTANEOUS | Status: DC
  Administered 2019-03-19: 40 mg via SUBCUTANEOUS
  Filled 2019-03-19: qty 0.4

## 2019-03-19 MED ORDER — OXYCODONE HCL 5 MG PO TABS: 5.0000 mg | ORAL_TABLET | Freq: Four times a day (QID) | ORAL | 0 refills | Status: DC | PRN

## 2019-03-19 MED ORDER — AMOXICILLIN-POT CLAVULANATE 875-125 MG PO TABS: 1.0000 | ORAL_TABLET | Freq: Two times a day (BID) | ORAL | 0 refills | Status: AC

## 2019-03-19 NOTE — Discharge Summary (Addendum)
[facility]/Trauma Discharge Summary   Patient ID: Nicholas Maynard MRN: [id number] DOB/AGE: [birth year] 28 y.o.  Admit date: 03/18/2019 Discharge date: 03/19/2019  Admitting Diagnosis: appendicitis  Discharge Diagnosis Patient Active Problem List   Diagnosis Date Noted  . Acute appendicitis 03/18/2019    Consultants none  Imaging: Ct Abdomen Pelvis W Contrast  Result Date: 03/18/2019 CLINICAL DATA:  Vomiting beginning 4 days ago. Right-sided abdominal pain since 4 a.m. this morning EXAM: CT ABDOMEN AND PELVIS WITH CONTRAST TECHNIQUE: Multidetector CT imaging of the abdomen and pelvis was performed using the standard protocol following bolus administration of intravenous contrast. CONTRAST:  OMNIPAQUE IOHEXOL 300 MG/ML  SOLN COMPARISON:  None. FINDINGS: Lower chest: Lung bases essentially clear.  Heart normal in size. Hepatobiliary: No focal liver abnormality is seen. No gallstones, gallbladder wall thickening, or biliary dilatation. Pancreas: Unremarkable. No pancreatic ductal dilatation or surrounding inflammatory changes. Spleen: Normal in size without focal abnormality. Adrenals/Urinary Tract: Adrenal glands are unremarkable. Kidneys are normal, without renal calculi, focal lesion, or hydronephrosis. Bladder is unremarkable. Stomach/Bowel: Appendix is dilated to 1 cm with wall thickening and adjacent inflammation. There is adjacent inflammatory stranding. No extraluminal air or adjacent fluid collection. Stomach is unremarkable. Small bowel and colon are normal in caliber. No wall thickening. No inflammation. Vascular/Lymphatic: No significant vascular findings are present. No enlarged abdominal or pelvic lymph nodes. Reproductive: Unremarkable Other: Small amount of pelvic free fluid.  No hernia. Musculoskeletal: Well-defined lucent lesion in the intertrochanteric region of the right proximal femur with a smooth thin sclerotic margin, consistent with bone cyst. No other  bone lesions. No fracture or acute finding. IMPRESSION: 1. Acute appendicitis. No evidence appendiceal rupture or of a periappendiceal abscess. 2. No other acute abnormality. 3. 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 4. No other abnormalities. Electronically Signed   By: Amie Portland M.D.   On: 03/18/2019 11:30    Procedures Dr. Ezzard Standing (03/18/19) - Laparoscopic Appendectomy  HPI: This is a 28 year old white male with no past medical history who began having intermittent nausea and vomiting on Monday.  By Tuesday this had resolved and then once again recurred on Wednesday.  It resolved again on Thursday but then returned this morning along with right sided abdominal pain.  He denies any diarrhea.  He denies any fevers at home but currently has a fever of 102 here in the emergency room.  Nothing made his pain better or worse.  Due to persistent pain he presented to the emergency department where he was evaluated and found to have acute appendicitis on the CT scan.  He was [facility].  Hospital Course:  Workup showed appendicitis.  Patient was admitted and underwent procedure listed above.  Tolerated procedure well and was transferred to the floor. Intraoperative findings were Acute focally ruptured  appendicitis.  Diet was advanced as tolerated.  On POD#1, the patient was voiding well, having flatus, tolerating diet, ambulating well, pain well controlled, vital signs stable, incisions c/d/i and felt stable for discharge home. 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 I informed pt that if he has any R hip pain or concerns to follow up with his PCP. I also discussed return precautions. Patient expressed understanding.   Patient will follow up as outlined below and knows to call with questions or  concerns.  Patient was discharged in good condition.  The [place] Substance controlled database was reviewed prior to prescribing narcotic pain medication to this patient.  Physical Exam: General:  Alert, NAD, pleasant, cooperative Cardio: RRR, S1 & S2 normal, no murmur, rubs, gallops Resp: Effort normal, lungs CTA bilaterally, no wheezes, rales, rhonchi Abd:  Soft, ND, normal bowel sounds, incisions with glue intact are well appearing and are without bleeding, drainage, or signs of infection. Mild ecchymosis of umbilical incision. Generalized TTP worse around umbilical incision. No peritonitis   Skin: warm and dry, no rashes noted  Allergies as of 03/19/2019   No Known Allergies     Medication List    STOP taking these medications   promethazine 25 MG/ML injection Commonly known as: PHENERGAN     TAKE these medications   amoxicillin-clavulanate 875-125 MG tablet Commonly known as: Augmentin Take 1 tablet by mouth every 12 (twelve) hours for 7 days.   oxyCODONE 5 MG immediate release tablet Commonly known as: Oxy IR/ROXICODONE Take 1 tablet (5 mg total) by mouth every 6 (six) hours as needed for moderate pain.       Follow-up [facility] Follow up on 04/06/2019.   Specialty: General Surgery Why: a provider will call you at [place]. please send a photo of your incisions to [email or web address] a few days prior. Please include your name and date of birth in the subject line. Contact information: [address] [place] [phone]       Mcarthur Rossetti, MD. Schedule an appointment as soon as possible for a visit in 1 month(s).   Specialty: Orthopedic Surgery Why: to discuss and for follow up regarding the cyst seen on CT in your right femur Contact information: [place] [address] [phone]            Signed: [facility] 03/19/2019, 9:21 AM Please see amion for pager for the following: M, T, W, & Friday 7:00am - 4:30pm Thursdays 7:00am -11:30am  Agree with above. He was also given information about his right femur cyst seen on CT scan.  Alphonsa Overall, MD, [facility] Office phone:  [phone]

## 2019-03-19 NOTE — Progress Notes (Addendum)
Patient has a diarrhea stool and stated "Janett Billow said it could be an issue if I have diarrhea". Ate 1/3 of his hamburger. Janett Billow notified of patient concern and states she will visit him. Donne Hazel, RN

## 2019-03-19 NOTE — Discharge Instructions (Signed)
LAPAROSCOPIC SURGERY: POST OP INSTRUCTIONS  1. DIET: Follow a light bland diet the first 24 hours after arrival home, such as soup, liquids, crackers, etc. Be sure to include lots of fluids daily. Avoid fast food or heavy meals as your are more likely to get nauseated. Eat a low fat the next few days after surgery.  2. Take your usually prescribed home medications unless otherwise directed. 3. PAIN CONTROL:  1. Pain is best controlled by a usual combination of three different methods TOGETHER:  1. Ice/Heat 2. Over the counter pain medication 3. Prescription pain medication 2. Most patients will experience some swelling and bruising around the incisions. Ice packs or heating pads (30-60 minutes up to 6 times a day) will help. Use ice for the first few days to help decrease swelling and bruising, then switch to heat to help relax tight/sore spots and speed recovery. Some people prefer to use ice alone, heat alone, alternating between ice & heat. Experiment to what works for you. Swelling and bruising can take several weeks to resolve.  3. It is helpful to take an over-the-counter pain medication regularly for the first few weeks. Choose one of the following that works best for you:  1. Naproxen (Aleve, etc) Two 220mg  tabs twice a day 2. Ibuprofen (Advil, etc) Three 200mg  tabs four times a day (every meal & bedtime) 3. Acetaminophen (Tylenol, etc) 500-650mg  four times a day (every meal & bedtime) 4. A prescription for pain medication (such as oxycodone, hydrocodone, etc) should be given to you upon discharge. Take your pain medication as prescribed.  1. If you are having problems/concerns with the prescription medicine (does not control pain, nausea, vomiting, rash, itching, etc), please call us [phone] to see if we need to switch you to a different pain medicine that will work better for you and/or control your side effect better. 2. If you need a refill on your pain medication, please  contact your pharmacy. They will contact our office to request authorization. Prescriptions will not be filled after 5 pm or on week-ends. 4. Avoid getting constipated. Between the surgery and the pain medications, it is common to experience some constipation. Increasing fluid intake and taking a fiber supplement (such as Metamucil, Citrucel, FiberCon, MiraLax, etc) 1-2 times a day regularly will usually help prevent this problem from occurring. A mild laxative (prune juice, Milk of Magnesia, MiraLax, etc) should be taken according to package directions if there are no bowel movements after 48 hours.  5. Watch out for diarrhea. If you have many loose bowel movements, simplify your diet to bland foods & liquids for a few days. Stop any stool softeners and decrease your fiber supplement. Switching to mild anti-diarrheal medications (Kayopectate, Pepto Bismol) can help. If this worsens or does not improve, please call us. 6. Wash / shower every day. You may shower over the dressings as they are waterproof. Continue to shower over incision(s) after the dressing is off. 7. Remove your waterproof bandages 5 days after surgery. You may leave the incision open to air. You may replace a dressing/Band-Aid to cover the incision for comfort if you wish.  8. ACTIVITIES as tolerated:  1. You may resume regular (light) daily activities beginning the next day--such as daily self-care, walking, climbing stairs--gradually increasing activities as tolerated. If you can walk 30 minutes without difficulty, it is safe to try more intense activity such as jogging, treadmill, bicycling, low-impact aerobics, swimming, etc. 2. Save the most intensive and strenuous activity  for last such as sit-ups, heavy lifting, contact sports, etc Refrain from any heavy lifting or straining until you are off narcotics for pain control.  3. DO NOT PUSH THROUGH PAIN. Let pain be your guide: If it hurts to do something, don't do it. Pain is your body  warning you to avoid that activity for another week until the pain goes down. 4. You may drive when you are no longer taking prescription pain medication, you can comfortably wear a seatbelt, and you can safely maneuver your car and apply brakes. 5. You may have sexual intercourse when it is comfortable.  9. FOLLOW UP in our office  1. Please call [facility] at [phone] to set up an appointment to see your surgeon in the office for a follow-up appointment approximately 2-3 weeks after your surgery. 2. Make sure that you call for this appointment the day you arrive home to insure a convenient appointment time.      10. IF YOU HAVE DISABILITY OR FAMILY LEAVE FORMS, BRING THEM TO THE               OFFICE FOR PROCESSING.   WHEN TO CALL us [phone]:  1. Poor pain control 2. Reactions / problems with new medications (rash/itching, nausea, etc)  3. Fever over 101.5 F (38.5 C) 4. Inability to urinate 5. Nausea and/or vomiting 6. Worsening swelling or bruising 7. Abdominal bloating, with no passing of gas and nausea 8. Increased pain, redness, or drainage from incisions  The clinic staff is available to answer your questions during regular business hours (8:30am-5pm). Please dont hesitate to call and ask to speak to one of our nurses for clinical concerns.  If you have a medical emergency, go to the nearest emergency room or call 911.  A surgeon from [facility] is always on call at the [facility], [place], [place], [place],  [address] ?  MAIN: [phone] ? TOLL FREE: [phone] ?  FAX [phone]  [email or web address]

## 2019-03-19 NOTE — Progress Notes (Signed)
Discharge and medication instructions reviewed with patient. Questions answered. Patient is taking an [place] home. Patient instructed that he cannot drive on the oxycodone her just received for pain and he agrees not to, stating his "car is at the other location" [facility]) . No prescriptions given to patient. Donne Hazel, RN

## 2019-03-20 ENCOUNTER — Other Ambulatory Visit: Payer: Self-pay | Admitting: Surgery

## 2019-03-20 MED ORDER — OXYCODONE HCL 5 MG PO TABS: 5.0000 mg | ORAL_TABLET | Freq: Four times a day (QID) | ORAL | 0 refills | Status: DC | PRN

## 2019-03-22 LAB — SURGICAL PATHOLOGY

## 2020-10-19 DIAGNOSIS — L723 Sebaceous cyst: Secondary | ICD-10-CM | POA: Insufficient documentation

## 2020-10-23 IMAGING — CT CT ABD-PELV W/ CM
2 of 4 series · 16 of 46 positions shown, 18 images · IV contrast (Omnipaque)
Comparison: None.

CLINICAL DATA: Vomiting beginning 4 days ago. Right-sided abdominal
pain since 4 a.m. this morning

EXAM:
CT ABDOMEN AND PELVIS WITH CONTRAST
TECHNIQUE: Multidetector CT imaging of the abdomen and pelvis was performed
using the standard protocol following bolus administration of
intravenous contrast.
CONTRAST:  100mL OMNIPAQUE IOHEXOL 300 MG/ML  SOLN

[Series 2: axial st · axial · 0.97mm/px · z∈[-586,-36]mm · 13 of 120 slices shown, 15 images]
[im 5/120  soft-tissue]
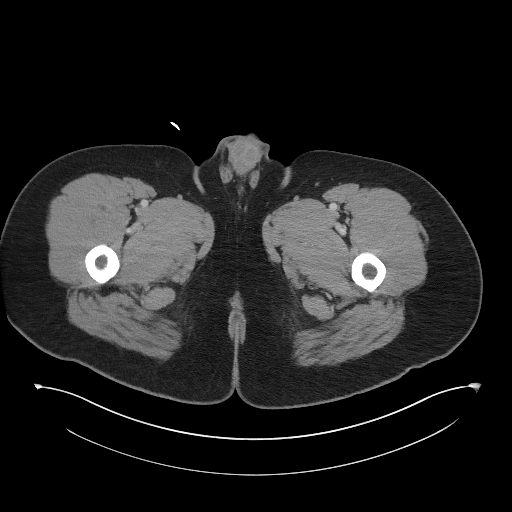
[im 5/120  bone]
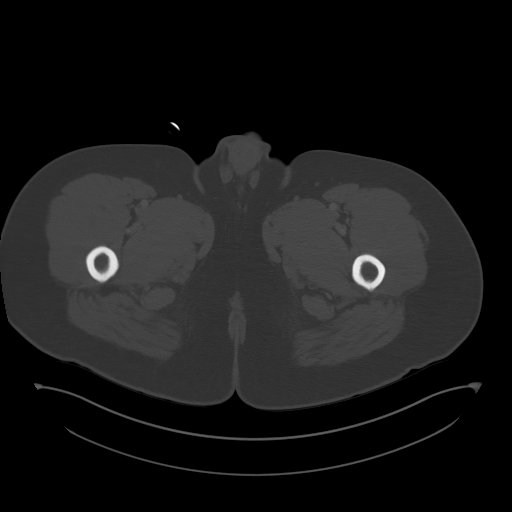
[im 15/120  soft-tissue]
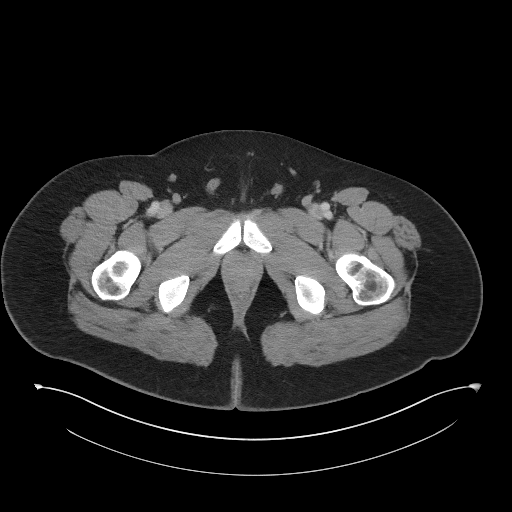
[im 25/120  soft-tissue]
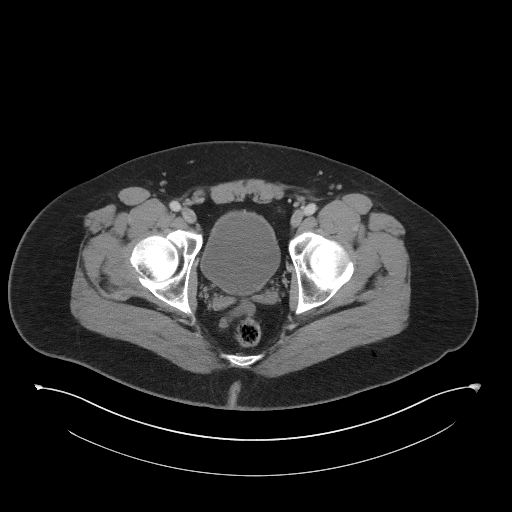
[im 35/120  soft-tissue]
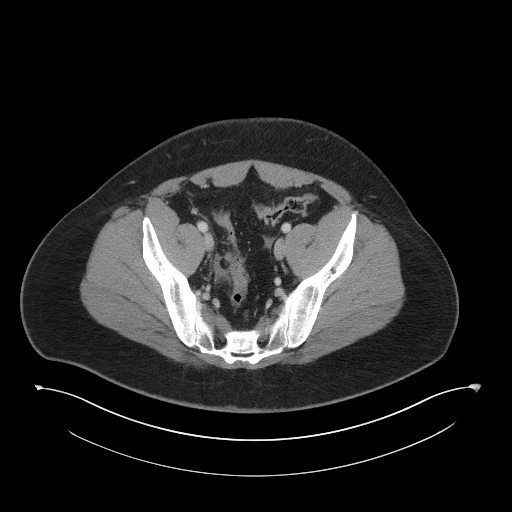
[im 40/120  soft-tissue]
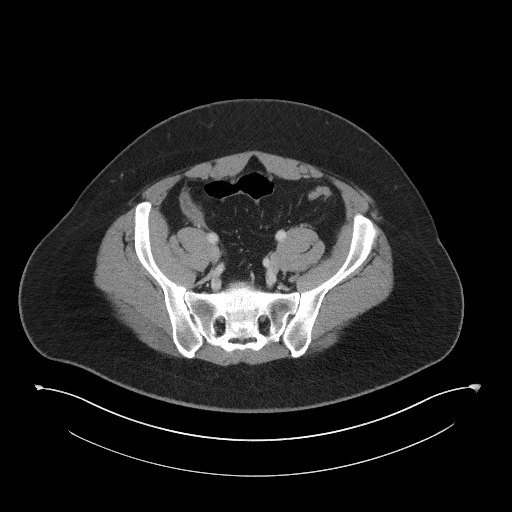
[im 50/120  soft-tissue]
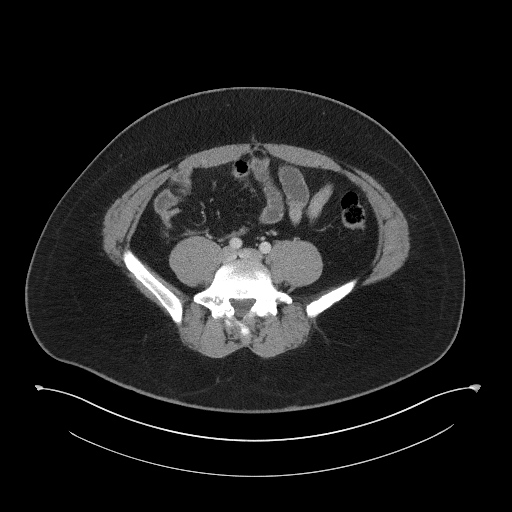
[im 60/120  soft-tissue]
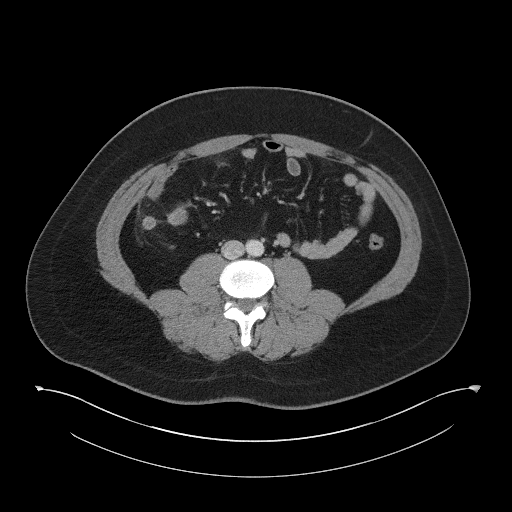
[im 70/120  soft-tissue]
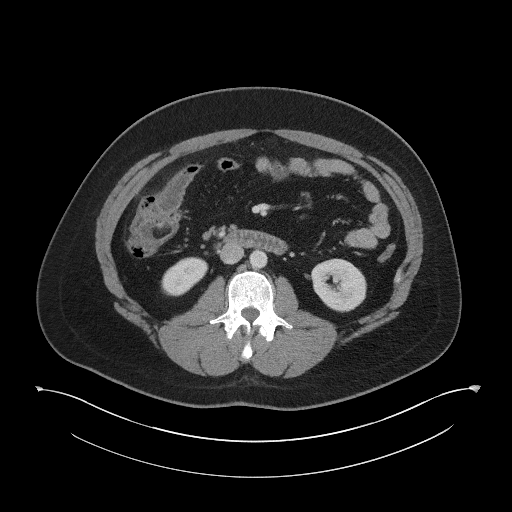
[im 80/120  soft-tissue]
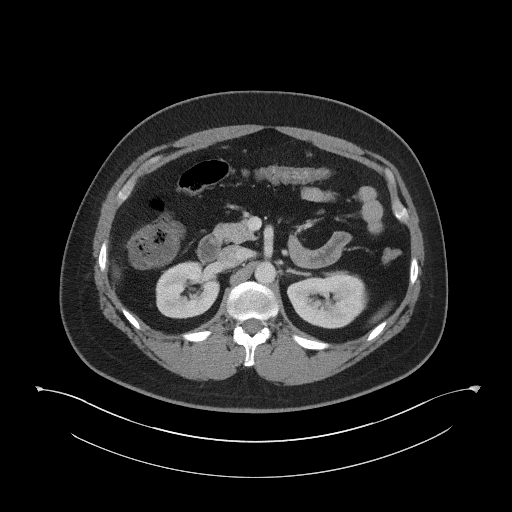
[im 80/120  bone]
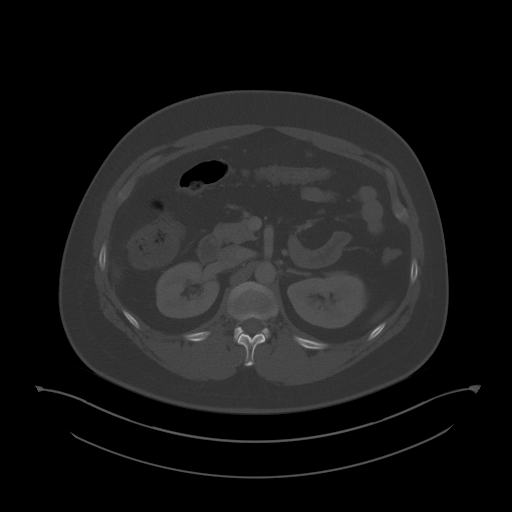
[im 85/120  soft-tissue]
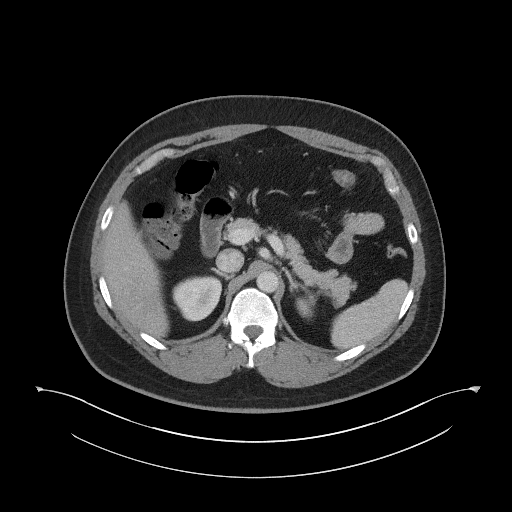
[im 95/120  soft-tissue]
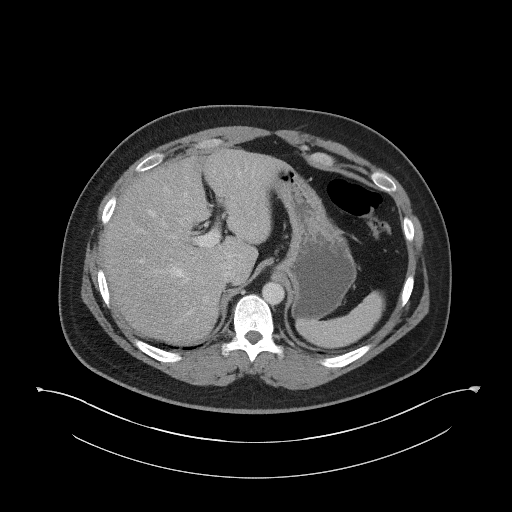
[im 105/120  soft-tissue]
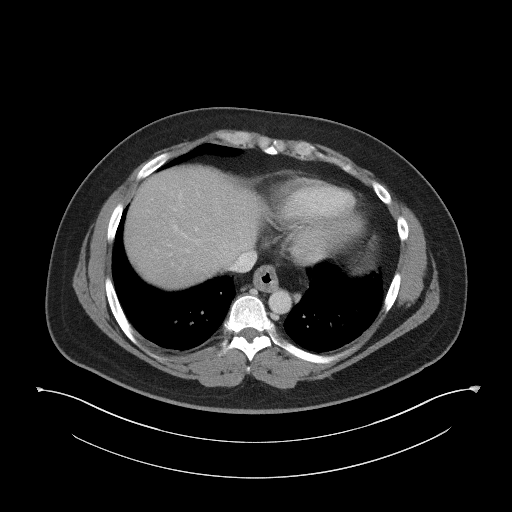
[im 115/120  soft-tissue]
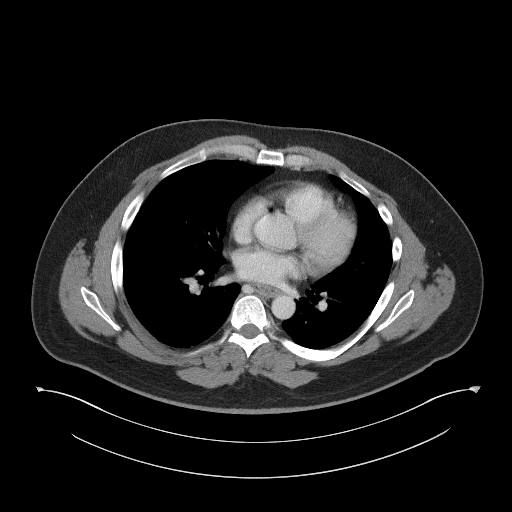

[Series 5: coronal st · coronal · 1.00mm/px · 3 of 109 slices shown]
[im 37/109  soft-tissue]
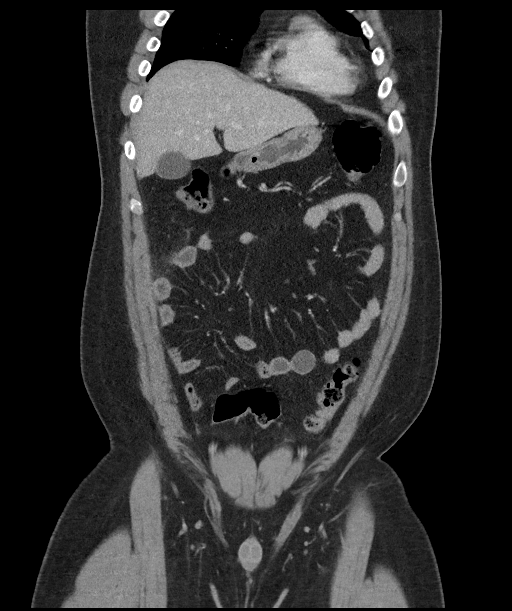
[im 49/109  soft-tissue]
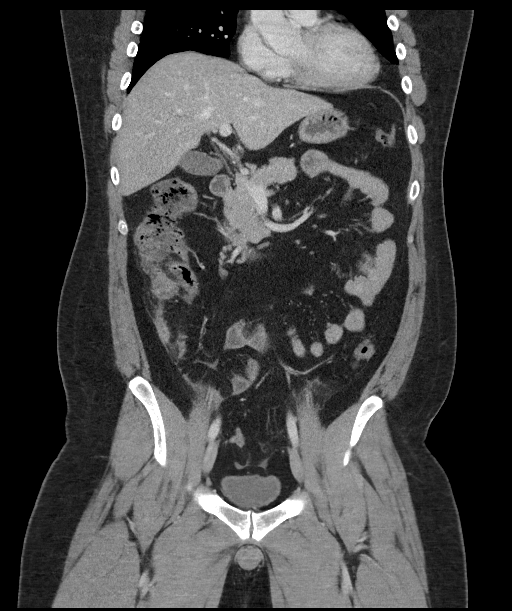
[im 61/109  soft-tissue]
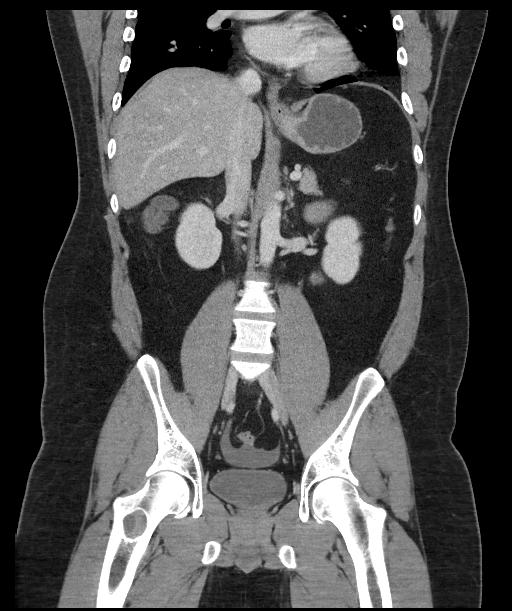

[16 of 46 positions shown; findings below may reference images not displayed]

FINDINGS: Lower chest: Lung bases essentially clear.  Heart normal in size.

Hepatobiliary: No focal liver abnormality is seen. No gallstones,
gallbladder wall thickening, or biliary dilatation.

Pancreas: Unremarkable. No pancreatic ductal dilatation or
surrounding inflammatory changes.

Spleen: Normal in size without focal abnormality.

Adrenals/Urinary Tract: Adrenal glands are unremarkable. Kidneys are
normal, without renal calculi, focal lesion, or hydronephrosis.
Bladder is unremarkable.

Stomach/Bowel: Appendix is dilated to 1 cm with wall thickening and
adjacent inflammation. There is adjacent inflammatory stranding. No
extraluminal air or adjacent fluid collection.

Stomach is unremarkable. Small bowel and colon are normal in
caliber. No wall thickening. No inflammation.

Vascular/Lymphatic: No significant vascular findings are present. No
enlarged abdominal or pelvic lymph nodes.

Reproductive: Unremarkable

Other: Small amount of pelvic free fluid.  No hernia.

Musculoskeletal: Well-defined lucent lesion in the intertrochanteric
region of the right proximal femur with a smooth thin sclerotic
margin, consistent with bone cyst. No other bone lesions. No
fracture or acute finding.
IMPRESSION: 1. Acute appendicitis. No evidence appendiceal rupture or of a
periappendiceal abscess.
2. No other acute abnormality.
3. Unicameral bone cyst, intertrochanteric region of the proximal
right femur.
4. No other abnormalities.

## 2020-12-30 ENCOUNTER — Emergency Department (HOSPITAL_BASED_OUTPATIENT_CLINIC_OR_DEPARTMENT_OTHER): Payer: Managed Care, Other (non HMO)

## 2020-12-30 ENCOUNTER — Emergency Department (HOSPITAL_BASED_OUTPATIENT_CLINIC_OR_DEPARTMENT_OTHER)
Admission: EM | Admit: 2020-12-30 | Discharge: 2020-12-30 | Disposition: A | Discharge status: Home / Self Care | Payer: Managed Care, Other (non HMO) | Attending: Emergency Medicine | Admitting: Emergency Medicine

## 2020-12-30 ENCOUNTER — Other Ambulatory Visit: Payer: Self-pay

## 2020-12-30 ENCOUNTER — Encounter (HOSPITAL_BASED_OUTPATIENT_CLINIC_OR_DEPARTMENT_OTHER): Payer: Self-pay | Admitting: *Deleted

## 2020-12-30 DIAGNOSIS — R799 Abnormal finding of blood chemistry, unspecified: Secondary | ICD-10-CM | POA: Insufficient documentation

## 2020-12-30 DIAGNOSIS — R000 Tachycardia, unspecified: Secondary | ICD-10-CM | POA: Diagnosis not present

## 2020-12-30 DIAGNOSIS — R059 Cough, unspecified: Secondary | ICD-10-CM | POA: Diagnosis not present

## 2020-12-30 DIAGNOSIS — R0602 Shortness of breath: Secondary | ICD-10-CM | POA: Diagnosis not present

## 2020-12-30 MED ORDER — AZITHROMYCIN 250 MG PO TABS: 250.0000 mg | ORAL_TABLET | Freq: Every day | ORAL | 0 refills | Status: DC

## 2020-12-30 MED ORDER — AZITHROMYCIN 250 MG PO TABS
500.0000 mg | ORAL_TABLET | Freq: Once | ORAL | Status: AC
  Administered 2020-12-30: 500 mg via ORAL
  Filled 2020-12-30: qty 2

## 2020-12-30 MED ORDER — IOHEXOL 350 MG/ML SOLN
100.0000 mL | Freq: Once | INTRAVENOUS | Status: AC | PRN
  Administered 2020-12-30: 100 mL via INTRAVENOUS

## 2020-12-30 NOTE — ED Triage Notes (Signed)
Pt exposed to covid on 9/4 and tested + on 9/7. States Sx resolved until 3 days ago and he's been having more difficulty breathing. He went to Urgent Care yesterday and had blood work and chest xray. They called him today and said his d-dimer was elevated

## 2020-12-30 NOTE — ED Provider Notes (Signed)
[facility] EMERGENCY DEPARTMENT Provider Note   CSN: [id number] Arrival date & time: 12/30/20  1937     History Chief Complaint  Patient presents with   Abnormal Lab    Nicholas Maynard is a 30 y.o. male.  He is a non-smoker and no significant past medical history.  He had COVID symptoms at the beginning of this month including cough nausea and vomiting.  He tested positive and isolated.  He was symptom-free for about a week until 3 days ago when he started experiencing more shortness of breath.  Little bit of a cough nonproductive.  No fevers.  He went to urgent care where they did blood work and x-ray and was called today that his D-dimer was elevated and he will need to have a CAT scan.  No prior history of DVT PE.  No hemoptysis.  The history is provided by the patient.  Abnormal Lab Time since result:  Yesterday Patient referred by:  ED personnel Resulting agency:  External Result type: coagulation studies   Coagulation studies:    Other abnormal coagulation study:  D-dimer elevated at 990 (nl <500) Shortness of Breath Severity:  Moderate Onset quality:  Gradual Duration:  3 days Timing:  Constant Progression:  Unchanged Chronicity:  New Relieved by:  Nothing Worsened by:  Activity Ineffective treatments:  Inhaler Associated symptoms: cough   Associated symptoms: no abdominal pain, no chest pain, no fever, no headaches, no hemoptysis, no rash, no sore throat, no sputum production and no vomiting   Risk factors: no tobacco use       History reviewed. No pertinent past medical history.  Patient Active Problem List   Diagnosis Date Noted   Acute appendicitis 03/18/2019    Past Surgical History:  Procedure Laterality Date   LAPAROSCOPIC APPENDECTOMY N/A 03/18/2019   Procedure: APPENDECTOMY LAPAROSCOPIC;  Surgeon: Ovidio Kin, MD;  Location: [facility];  Service: General;  Laterality: N/A;       No family history on file.  Social History   Tobacco Use    Smoking status: Never   Smokeless tobacco: Never  Vaping Use   Vaping Use: Never used  Substance Use Topics   Alcohol use: Yes    Comment: occasional   Drug use: Not Currently    Types: Marijuana    Comment: over 3 months ago    Home Medications Prior to Admission medications   Medication Sig Start Date End Date Taking? Authorizing Provider  oxyCODONE (OXY IR/ROXICODONE) 5 MG immediate release tablet Take 1 tablet (5 mg total) by mouth every 6 (six) hours as needed for moderate pain. 03/20/19   Abigail Miyamoto, MD    Allergies    Patient has no known allergies.  Review of Systems   Review of Systems  Constitutional:  Negative for fever.  HENT:  Negative for sore throat.   Eyes:  Negative for visual disturbance.  Respiratory:  Positive for cough and shortness of breath. Negative for hemoptysis and sputum production.   Cardiovascular:  Negative for chest pain.  Gastrointestinal:  Negative for abdominal pain and vomiting.  Genitourinary:  Negative for dysuria.  Musculoskeletal:  Negative for back pain.  Skin:  Negative for rash.  Neurological:  Negative for headaches.   Physical Exam Updated Vital Signs BP (!) 151/110 (BP Location: Left Arm)   Pulse (!) 127   Temp 100.3 F (37.9 C) (Oral)   Resp (!) 26   Ht 5' 11.5" (1.816 m)   Wt 112.5 kg  SpO2 95%   BMI 34.11 kg/m   Physical Exam Vitals and nursing note reviewed.  Constitutional:      Appearance: Normal appearance. He is well-developed.  HENT:     Head: Normocephalic and atraumatic.  Eyes:     Conjunctiva/sclera: Conjunctivae normal.  Cardiovascular:     Rate and Rhythm: Regular rhythm. Tachycardia present.     Heart sounds: No murmur heard. Pulmonary:     Effort: Pulmonary effort is normal. Tachypnea present. No respiratory distress.     Breath sounds: Normal breath sounds.  Abdominal:     Palpations: Abdomen is soft.     Tenderness: There is no abdominal tenderness.  Musculoskeletal:         General: No tenderness. Normal range of motion.     Cervical back: Neck supple.     Right lower leg: No edema.     Left lower leg: No edema.  Skin:    General: Skin is warm and dry.  Neurological:     General: No focal deficit present.     Mental Status: He is alert.    ED Results / Procedures / Treatments   Labs (all labs ordered are listed, but only abnormal results are displayed) Labs Reviewed - No data to display Patient had labs done at [facility] today.  Normal chemistry normal BUN and creatinine normal LFTs.  BNP normal.  CBC with normal white count normal hemoglobin normal platelets.  D-dimer elevated.  Troponin normal.  TSH normal   EKG EKG Interpretation  Date/Time:  Saturday December 30 2020 19:58:26 EDT Ventricular Rate:  129 PR Interval:  156 QRS Duration: 86 QT Interval:  290 QTC Calculation: 424 R Axis:   102 Text Interpretation: Sinus tachycardia Rightward axis Cannot rule out Anterior infarct , age undetermined Abnormal ECG No old tracing to compare Confirmed by Meridee Score [phone]) on 12/30/2020 8:00:34 PM  Radiology CT Angio Chest PE W/Cm &/Or Wo Cm  Result Date: 12/30/2020 CLINICAL DATA:  Pulmonary embolus suspected with low to intermediate probability. Positive D-dimer. COVID exposure and COVID positive. Difficulty breathing. EXAM: CT ANGIOGRAPHY CHEST WITH CONTRAST TECHNIQUE: Multidetector CT imaging of the chest was performed using the standard protocol during bolus administration of intravenous contrast. Multiplanar CT image reconstructions and MIPs were obtained to evaluate the vascular anatomy. CONTRAST:  OMNIPAQUE IOHEXOL 350 MG/ML SOLN COMPARISON:  Chest radiograph 12/30/2020 FINDINGS: Cardiovascular: Satisfactory opacification of the pulmonary arteries to the segmental level. No evidence of pulmonary embolism. Normal heart size. No pericardial effusion. Mediastinum/Nodes: No enlarged mediastinal, hilar, or axillary lymph nodes. Thyroid gland,  trachea, and esophagus demonstrate no significant findings. Lungs/Pleura: Motion artifact limits examination. Suggestion of developing airspace disease or edema in the lung bases, greater on the left. Upper Abdomen: No acute abnormality. Musculoskeletal: No chest wall abnormality. No acute or significant osseous findings. Review of the MIP images confirms the above findings. IMPRESSION: 1. No evidence of significant pulmonary embolus. 2. Suggestion of mild airspace disease in the lung bases, possibly edema or multifocal pneumonia. Electronically Signed   By: Burman Nieves M.D.   On: 12/30/2020 21:27    Procedures Procedures   Medications Ordered in ED Medications - No data to display  ED Course  I have reviewed the triage vital signs and the nursing notes.  Pertinent labs & imaging results that were available during my care of the patient were reviewed by me and considered in my medical decision making (see chart for details).  Clinical Course as of 12/31/20  0932  Sat Dec 30, 2020  2132 CT is read as no evidence of PE.  They do comment upon some airspace disease question edema versus infection.  He had a BNP done today so we will cover with antibiotics. [MB]    Clinical Course User Index [MB] Terrilee Files, MD   MDM Rules/Calculators/A&P                          Reyaansh Merlo was evaluated in Emergency Department on 12/30/2020 for the symptoms described in the history of present illness. He was evaluated in the context of the global COVID-19 pandemic, which necessitated consideration that the patient might be at risk for infection with the SARS-CoV-2 virus that causes COVID-19. Institutional protocols and algorithms that pertain to the evaluation of patients at risk for COVID-19 are in a state of rapid change based on information released by regulatory bodies including the CDC and federal and state organizations. These policies and algorithms were followed during the patient's care in  the ED.  This patient complains of shortness of breath and elevated D-dimer; this involves an extensive number of treatment Options and is a complaint that carries with it a high risk of complications and Morbidity. The differential includes post COVID shortness of breath, pneumonia, PE  I reviewed and interpreted labs, which included normal CBC and chemistries, D-dimer elevated normal troponin and BNP I ordered medication oral antibiotics I ordered imaging studies which included CT angio chest and I independently    visualized and interpreted imaging which showed no PE.  Does have some inflammatory changes possible early trait  Previous records obtained and reviewed in epic including prior evaluation at [facility]  After the interventions stated above, I reevaluated the patient and found patient's vital signs to be trended the right direction.  Reviewed work-up with him and he is comfortable plan for outpatient management of symptoms with antibiotics.  He already has inhaler given by urgent care.  Return instructions discussed   Final Clinical Impression(s) / ED Diagnoses Final diagnoses:  SOB (shortness of breath)    Rx / DC Orders ED Discharge Orders          Ordered    azithromycin (ZITHROMAX) 250 MG tablet  Daily        12/30/20 2206             Terrilee Files, MD 12/31/20 [phone]

## 2020-12-30 NOTE — ED Notes (Signed)
Patient left ED with ABCs intact, alert and oriented x4, respirations even and unlabored. Discharge instructions reviewed and all questions answered.   

## 2020-12-30 NOTE — Discharge Instructions (Addendum)
You were seen in the emergency department for increased shortness of breath after recent COVID infection.  You had lab work done at [facility] that did not show any significant abnormalities other than your D-dimer was elevated.  You had a CAT scan here that did not show any evidence of a blood clot.  Radiology did say there may be some early signs of pneumonia.  We are covering you with an antibiotic.  Please follow-up with your treating providers.  Return to the emergency department if any worsening or concerning symptoms

## 2021-05-31 ENCOUNTER — Ambulatory Visit: Payer: Managed Care, Other (non HMO) | Admitting: Medical-Surgical

## 2021-05-31 ENCOUNTER — Other Ambulatory Visit: Payer: Self-pay

## 2021-05-31 ENCOUNTER — Encounter: Payer: Self-pay | Admitting: Medical-Surgical

## 2021-05-31 VITALS — BP 158/117 | HR 71 | Resp 20 | Ht 71.5 in | Wt 253.9 lb

## 2021-05-31 DIAGNOSIS — R030 Elevated blood-pressure reading, without diagnosis of hypertension: Secondary | ICD-10-CM | POA: Diagnosis not present

## 2021-05-31 DIAGNOSIS — Z7689 Persons encountering health services in other specified circumstances: Secondary | ICD-10-CM | POA: Diagnosis not present

## 2021-05-31 DIAGNOSIS — R112 Nausea with vomiting, unspecified: Secondary | ICD-10-CM

## 2021-05-31 DIAGNOSIS — K529 Noninfective gastroenteritis and colitis, unspecified: Secondary | ICD-10-CM | POA: Diagnosis not present

## 2021-05-31 MED ORDER — PANTOPRAZOLE SODIUM 40 MG PO TBEC: DELAYED_RELEASE_TABLET | ORAL | 0 refills | Status: DC

## 2021-05-31 NOTE — Patient Instructions (Signed)
Food Choices for Gastroesophageal Reflux Disease, Adult °When you have gastroesophageal reflux disease (GERD), the foods you eat and your eating habits are very important. Choosing the right foods can help ease the discomfort of GERD. Consider working with a dietitian to help you make healthy food choices. °What are tips for following this plan? °Reading food labels °Look for foods that are low in saturated fat. Foods that have less than 5% of daily value (DV) of fat and 0 g of trans fats may help with your symptoms. °Cooking °Cook foods using methods other than frying. This may include baking, steaming, grilling, or broiling. These are all methods that do not need a lot of fat for cooking. °To add flavor, try to use herbs that are low in spice and acidity. °Meal planning ° °Choose healthy foods that are low in fat, such as fruits, vegetables, whole grains, low-fat dairy products, lean meats, fish, and poultry. °Eat frequent, small meals instead of three large meals each day. Eat your meals slowly, in a relaxed setting. Avoid bending over or lying down until 2-3 hours after eating. °Limit high-fat foods such as fatty meats or fried foods. °Limit your intake of fatty foods, such as oils, butter, and shortening. °Avoid the following as told by your health care provider: °Foods that cause symptoms. These may be different for different people. Keep a food diary to keep track of foods that cause symptoms. °Alcohol. °Drinking large amounts of liquid with meals. °Eating meals during the 2-3 hours before bed. °Lifestyle °Maintain a healthy weight. Ask your health care provider what weight is healthy for you. If you need to lose weight, work with your health care provider to do so safely. °Exercise for at least 30 minutes on 5 or more days each week, or as told by your health care provider. °Avoid wearing clothes that fit tightly around your waist and chest. °Do not use any products that contain nicotine or tobacco. These  products include cigarettes, chewing tobacco, and vaping devices, such as e-cigarettes. If you need help quitting, ask your health care provider. °Sleep with the head of your bed raised. Use a wedge under the mattress or blocks under the bed frame to raise the head of the bed. °Chew sugar-free gum after mealtimes. °What foods should I eat? °Eat a healthy, well-balanced diet of fruits, vegetables, whole grains, low-fat dairy products, lean meats, fish, and poultry. Each person is different. Foods that may trigger symptoms in one person may not trigger any symptoms in another person. Work with your health care provider to identify foods that are safe for you. °The items listed above may not be a complete list of recommended foods and beverages. Contact a dietitian for more information. °What foods should I avoid? °Limiting some of these foods may help manage the symptoms of GERD. Everyone is different. Consult a dietitian or your health care provider to help you identify the exact foods to avoid, if any. °Fruits °Any fruits prepared with added fat. Any fruits that cause symptoms. For some people this may include citrus fruits, such as oranges, grapefruit, pineapple, and lemons. °Vegetables °Deep-fried vegetables. French fries. Any vegetables prepared with added fat. Any vegetables that cause symptoms. For some people, this may include tomatoes and tomato products, chili peppers, onions and garlic, and horseradish. °Grains °Pastries or quick breads with added fat. °Meats and other proteins °High-fat meats, such as fatty beef or pork, hot dogs, ribs, ham, sausage, salami, and bacon. Fried meat or protein, including fried   fish and fried chicken. Nuts and nut butters, in large amounts. °Dairy °Whole milk and chocolate milk. Sour cream. Cream. Ice cream. Cream cheese. Milkshakes. °Fats and oils °Butter. Margarine. Shortening. Ghee. °Beverages °Coffee and tea, with or without caffeine. Carbonated beverages. Sodas. Energy  drinks. Fruit juice made with acidic fruits, such as orange or grapefruit. Tomato juice. Alcoholic drinks. °Sweets and desserts °Chocolate and cocoa. Donuts. °Seasonings and condiments °Pepper. Peppermint and spearmint. Added salt. Any condiments, herbs, or seasonings that cause symptoms. For some people, this may include curry, hot sauce, or vinegar-based salad dressings. °The items listed above may not be a complete list of foods and beverages to avoid. Contact a dietitian for more information. °Questions to ask your health care provider °Diet and lifestyle changes are usually the first steps that are taken to manage symptoms of GERD. If diet and lifestyle changes do not improve your symptoms, talk with your health care provider about taking medicines. °Where to find more information °International Foundation for Gastrointestinal Disorders: aboutgerd.org °Summary °When you have gastroesophageal reflux disease (GERD), food and lifestyle choices may be very helpful in easing the discomfort of GERD. °Eat frequent, small meals instead of three large meals each day. Eat your meals slowly, in a relaxed setting. Avoid bending over or lying down until 2-3 hours after eating. °Limit high-fat foods such as fatty meats or fried foods. °This information is not intended to replace advice given to you by your health care provider. Make sure you discuss any questions you have with your health care provider. °Document Revised: 10/11/2019 Document Reviewed: 10/11/2019 °Elsevier Patient Education © 2022 Elsevier Inc. ° °

## 2021-05-31 NOTE — Progress Notes (Addendum)
New Patient Office Visit  Subjective:  Patient ID: Dimari Confer, male    DOB: 02-Dec-1990  Age: 31 y.o. MRN: [id number]  CC:  Chief Complaint  Patient presents with   Establish Care     HPI Denarius Szekely presents to establish care.  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  He is not currently married and does not have any children.  Has been having approximately 3 months of nausea that occurs on a daily basis and is intermittent.  He was previously vomiting at least once every 2 weeks however this seems to have slowed down to approximately once per month.  It usually happens first thing in the morning.  Notes that he has had heartburn for years but does not currently taking any medications for it.  He has tried Tums in the past but its been a while since he has taken any of that.  Notes that he has approximately 3-4 bowel movements per day and they vary from formed to loose.  Does not have a cough however does wake with an increased amount of phlegm in his throat every morning.  Reports that pretzels and crackers do seem to help with the heartburn and the nausea.  Has been trying to make healthier choices in the last few weeks but does admit to eating a lot of delivered food.  Was previously eating fast food 2-3 times per week but has cut back on this over the last month or so.  Denies profound constipation/diarrhea, hematochezia, melena, hematemesis, bloating, excess flatulence, and abdominal pain/cramping.  No fevers or chills.  No recent GI illnesses.  Had an issue with dizziness/vertigo on Tuesday but this was temporary and has since resolved.  No history of the symptoms and not occurring today.  He does have a history of headaches and neck pain however he has been seeing a chiropractor and reports this is helping with his symptoms.  History reviewed. No pertinent  past medical history.  Past Surgical History:  Procedure Laterality Date   LAPAROSCOPIC APPENDECTOMY N/A 03/18/2019   Procedure: APPENDECTOMY LAPAROSCOPIC;  Surgeon: Alphonsa Overall, MD;  Location: [facility];  Service: General;  Laterality: N/A;    Family History  Adopted: Yes    Social History   Socioeconomic History   Marital status: Single    Spouse name: Not on file   Number of children: Not on file   Years of education: Not on file   Highest education level: Not on file  Occupational History   Not on file  Tobacco Use   Smoking status: Never   Smokeless tobacco: Never  Vaping Use   Vaping Use: Never used  Substance and Sexual Activity   Alcohol use: Yes    Comment: occasional   Drug use: Never    Types: Marijuana    Comment: over 3 months ago   Sexual activity: Yes    Partners: Female    Birth control/protection: Condom  Other Topics Concern   Not on file  Social History Narrative   Not on file   Social Determinants of Health   Financial Resource Strain: Not on file  Food Insecurity: Not on file  Transportation Needs: Not on file  Physical Activity: Not on file  Stress: Not on file  Social Connections: Not on file  Intimate Partner Violence: Not on file  ROS Review of Systems  Constitutional:  Negative for chills, fatigue, fever and unexpected weight change.  HENT:  Negative for congestion, rhinorrhea, sinus pressure and sore throat.   Eyes:  Negative for visual disturbance.  Respiratory:  Negative for cough, chest tightness, shortness of breath and wheezing.   Cardiovascular:  Negative for chest pain, palpitations and leg swelling.  Gastrointestinal:  Positive for nausea and vomiting. Negative for abdominal pain, constipation and diarrhea.       Heartburn  Genitourinary:  Negative for dysuria, frequency and urgency.  Musculoskeletal:  Positive for neck pain.  Skin:  Negative for rash.  Neurological:  Positive for dizziness and headaches. Negative for  light-headedness.  Psychiatric/Behavioral:  Negative for dysphoric mood, self-injury, sleep disturbance and suicidal ideas. The patient is not nervous/anxious.    Objective:   Today's Vitals: BP (!) 158/117 (BP Location: Left Arm, Patient Position: Sitting, Cuff Size: Large)    Pulse 71    Resp 20    Ht 5' 11.5" (1.816 m)    Wt 253 lb 14.4 oz (115.2 kg)    SpO2 98%    BMI 34.92 kg/m   Physical Exam Vitals and nursing note reviewed.  Constitutional:      General: He is not in acute distress.    Appearance: Normal appearance.  HENT:     Head: Normocephalic and atraumatic.  Cardiovascular:     Rate and Rhythm: Normal rate and regular rhythm.     Pulses: Normal pulses.     Heart sounds: Normal heart sounds. No murmur heard.   No friction rub. No gallop.  Pulmonary:     Effort: Pulmonary effort is normal. No respiratory distress.     Breath sounds: Normal breath sounds.  Abdominal:     General: Bowel sounds are normal. There is no distension.     Palpations: Abdomen is soft. There is no mass.     Tenderness: There is no abdominal tenderness. There is no guarding or rebound.     Hernia: No hernia is present.  Skin:    General: Skin is warm and dry.  Neurological:     Mental Status: He is alert and oriented to person, place, and time.  Psychiatric:        Mood and Affect: Mood normal.        Behavior: Behavior normal.        Thought Content: Thought content normal.        Judgment: Judgment normal.    Assessment & Plan:   1. Encounter to establish care Reviewed available information and discussed care concerns with patient.   2. Nausea and vomiting, unspecified vomiting type 3. Frequent stools Checking labs as below.  We will go ahead and do an H. pylori breath test today.  Starting pantoprazole 40 mg twice daily x14 days then decrease to 40 mg once daily.  Suspect his symptoms are related to uncontrolled GERD.  Discussed foods to avoid with known reflux issues.  Information  provided with [facility]. - H. pylori breath test - CBC with Differential - COMPLETE METABOLIC PANEL WITH GFR - TSH - Amylase - Lipase - Hemoglobin A1c  4.  Elevated blood pressure reading Blood pressure significantly elevated at 160/113 on arrival.  Patient does note that he was significantly anxious on arrival to the appointment.  At the end of our visit, recheck of blood pressure was 158/117.  Consider whitecoat syndrome however we will recheck this when he returns for his follow-up.  If still elevated  at that time, we will have him monitor blood pressures at home with a goal of 130/80 and if consistently elevated, consider antihypertensive medications.  Outpatient Encounter Medications as of 05/31/2021  Medication Sig   pantoprazole (PROTONIX) 40 MG tablet Take 1 tablet (40 mg total) by mouth 2 (two) times daily for 14 days, THEN 1 tablet (40 mg total) daily for 16 days.   [DISCONTINUED] azithromycin (ZITHROMAX) 250 MG tablet Take 1 tablet (250 mg total) by mouth daily.   [DISCONTINUED] oxyCODONE (OXY IR/ROXICODONE) 5 MG immediate release tablet Take 1 tablet (5 mg total) by mouth every 6 (six) hours as needed for moderate pain.   No facility-administered encounter medications on file as of 05/31/2021.    Follow-up: Return in about 4 weeks (around 06/28/2021) for Nausea follow-up.   Clearnce Sorrel, DNP, APRN, FNP-BC [facility]

## 2021-06-04 LAB — CBC WITH DIFFERENTIAL/PLATELET
Absolute Monocytes: 577 cells/uL (ref 200–950)
Basophils Absolute: 133 cells/uL (ref 0–200)
Basophils Relative: 1.7 %
Eosinophils Absolute: 382 cells/uL (ref 15–500)
Eosinophils Relative: 4.9 %
HCT: 46.7 % (ref 38.5–50.0)
Hemoglobin: 15.5 g/dL (ref 13.2–17.1)
Lymphs Abs: 2410 cells/uL (ref 850–3900)
MCH: 27.3 pg (ref 27.0–33.0)
MCHC: 33.2 g/dL (ref 32.0–36.0)
MCV: 82.4 fL (ref 80.0–100.0)
MPV: 11.1 fL (ref 7.5–12.5)
Monocytes Relative: 7.4 %
Neutro Abs: 4298 cells/uL (ref 1500–7800)
Neutrophils Relative %: 55.1 %
Platelets: 315 10*3/uL (ref 140–400)
RBC: 5.67 10*6/uL (ref 4.20–5.80)
RDW: 13.4 % (ref 11.0–15.0)
Total Lymphocyte: 30.9 %
WBC: 7.8 10*3/uL (ref 3.8–10.8)

## 2021-06-04 LAB — HEMOGLOBIN A1C
Hgb A1c MFr Bld: 5.3 % of total Hgb (ref <5.7)
Mean Plasma Glucose: 105 mg/dL
eAG (mmol/L): 5.8 mmol/L

## 2021-06-04 LAB — COMPLETE METABOLIC PANEL WITH GFR
AG Ratio: 1.5 (calc) (ref 1.0–2.5)
ALT: 23 U/L (ref 9–46)
AST: 16 U/L (ref 10–40)
Albumin: 4.3 g/dL (ref 3.6–5.1)
Alkaline phosphatase (APISO): 82 U/L (ref 36–130)
BUN: 9 mg/dL (ref 7–25)
CO2: 28 mmol/L (ref 20–32)
Calcium: 9.5 mg/dL (ref 8.6–10.3)
Chloride: 105 mmol/L (ref 98–110)
Creat: 0.97 mg/dL (ref 0.60–1.24)
Globulin: 2.9 g/dL (calc) (ref 1.9–3.7)
Glucose, Bld: 88 mg/dL (ref 65–99)
Potassium: 4.1 mmol/L (ref 3.5–5.3)
Sodium: 142 mmol/L (ref 135–146)
Total Bilirubin: 0.6 mg/dL (ref 0.2–1.2)
Total Protein: 7.2 g/dL (ref 6.1–8.1)
eGFR: 109 mL/min/{1.73_m2} (ref >=60)

## 2021-06-04 LAB — H. PYLORI BREATH TEST: H. pylori Breath Test: NOT DETECTED

## 2021-06-04 LAB — AMYLASE: Amylase: 14 U/L — ABNORMAL LOW (ref 21–101)

## 2021-06-04 LAB — TSH: TSH: 1.25 mIU/L (ref 0.40–4.50)

## 2021-06-04 LAB — LIPASE: Lipase: 12 U/L (ref 7–60)

## 2022-03-15 ENCOUNTER — Other Ambulatory Visit: Payer: Self-pay

## 2022-03-25 ENCOUNTER — Other Ambulatory Visit: Payer: Self-pay

## 2022-04-12 ENCOUNTER — Ambulatory Visit (INDEPENDENT_AMBULATORY_CARE_PROVIDER_SITE_OTHER): Payer: Managed Care, Other (non HMO) | Admitting: Medical-Surgical

## 2022-04-12 ENCOUNTER — Encounter: Payer: Self-pay | Admitting: Medical-Surgical

## 2022-04-12 VITALS — BP 157/112 | HR 75 | Resp 20 | Ht 71.5 in | Wt 247.1 lb

## 2022-04-12 DIAGNOSIS — Z800 Family history of malignant neoplasm of digestive organs: Secondary | ICD-10-CM

## 2022-04-12 DIAGNOSIS — Z0000 Encounter for general adult medical examination without abnormal findings: Secondary | ICD-10-CM | POA: Diagnosis not present

## 2022-04-12 DIAGNOSIS — Z131 Encounter for screening for diabetes mellitus: Secondary | ICD-10-CM

## 2022-04-12 DIAGNOSIS — Z1329 Encounter for screening for other suspected endocrine disorder: Secondary | ICD-10-CM | POA: Diagnosis not present

## 2022-04-12 DIAGNOSIS — R030 Elevated blood-pressure reading, without diagnosis of hypertension: Secondary | ICD-10-CM
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
## 2022-04-25 ENCOUNTER — Ambulatory Visit (INDEPENDENT_AMBULATORY_CARE_PROVIDER_SITE_OTHER): Payer: Managed Care, Other (non HMO) | Admitting: Medical-Surgical

## 2022-04-25 VITALS — BP 149/102 | HR 68 | Ht 71.5 in | Wt 245.0 lb

## 2022-04-25 DIAGNOSIS — R030 Elevated blood-pressure reading, without diagnosis of hypertension: Secondary | ICD-10-CM | POA: Diagnosis not present

## 2022-04-25 MED ORDER — LISINOPRIL 10 MG PO TABS: 10.0000 mg | ORAL_TABLET | Freq: Every day | ORAL | 3 refills | Status: DC

## 2022-04-25 NOTE — Progress Notes (Signed)
Agree with documentation as below.  ___________________________________________ Quamesha Mullet L. Hermine Feria, DNP, APRN, FNP-BC Primary Care and Sports Medicine [facility]  

## 2022-04-25 NOTE — Progress Notes (Signed)
   Subjective:    Patient ID: Nicholas Maynard, male    DOB: 06-26-1990, 32 y.o.   MRN: [id number]  HPI Pt here for blood pressure check, pt states he is not currently on bp medication and states he has been having headaches due to the flu, pt denies blurred vision or palpitations.    Review of Systems     Objective:   Physical Exam        Assessment & Plan:   Pt bp today was 149/102, PCP advised pt to start lisinopril 10 mg daily, follow a low sodium diet and start intentional exercise, follow up in 2 weeks for bp check.

## 2022-05-13 LAB — CBC WITH DIFFERENTIAL/PLATELET
Absolute Monocytes: 448 cells/uL (ref 200–950)
Basophils Absolute: 112 cells/uL (ref 0–200)
Basophils Relative: 1.6 %
Eosinophils Absolute: 217 cells/uL (ref 15–500)
Eosinophils Relative: 3.1 %
HCT: 47.1 % (ref 38.5–50.0)
Hemoglobin: 15.9 g/dL (ref 13.2–17.1)
Lymphs Abs: 2016 cells/uL (ref 850–3900)
MCH: 27.9 pg (ref 27.0–33.0)
MCHC: 33.8 g/dL (ref 32.0–36.0)
MCV: 82.8 fL (ref 80.0–100.0)
MPV: 10.8 fL (ref 7.5–12.5)
Monocytes Relative: 6.4 %
Neutro Abs: 4207 cells/uL (ref 1500–7800)
Neutrophils Relative %: 60.1 %
Platelets: 322 10*3/uL (ref 140–400)
RBC: 5.69 10*6/uL (ref 4.20–5.80)
RDW: 13.1 % (ref 11.0–15.0)
Total Lymphocyte: 28.8 %
WBC: 7 10*3/uL (ref 3.8–10.8)

## 2022-05-13 LAB — LYNCH SYNDROME PANEL
Clinical Interpretation: POSITIVE
Result: POSITIVE

## 2022-05-13 LAB — COMPLETE METABOLIC PANEL WITH GFR
AG Ratio: 1.7 (calc) (ref 1.0–2.5)
ALT: 22 U/L (ref 9–46)
AST: 13 U/L (ref 10–40)
Albumin: 4.5 g/dL (ref 3.6–5.1)
Alkaline phosphatase (APISO): 77 U/L (ref 36–130)
BUN: 8 mg/dL (ref 7–25)
CO2: 25 mmol/L (ref 20–32)
Calcium: 9.4 mg/dL (ref 8.6–10.3)
Chloride: 106 mmol/L (ref 98–110)
Creat: 0.83 mg/dL (ref 0.60–1.24)
Globulin: 2.6 g/dL (calc) (ref 1.9–3.7)
Glucose, Bld: 82 mg/dL (ref 65–99)
Potassium: 4.7 mmol/L (ref 3.5–5.3)
Sodium: 143 mmol/L (ref 135–146)
Total Bilirubin: 0.6 mg/dL (ref 0.2–1.2)
Total Protein: 7.1 g/dL (ref 6.1–8.1)
eGFR: 122 mL/min/{1.73_m2} (ref >=60)

## 2022-05-13 LAB — LIPID PANEL
Cholesterol: 191 mg/dL (ref <200)
HDL: 46 mg/dL (ref >=40)
LDL Cholesterol (Calc): 118 mg/dL (calc) — ABNORMAL HIGH
Non-HDL Cholesterol (Calc): 145 mg/dL (calc) — ABNORMAL HIGH (ref <130)
Total CHOL/HDL Ratio: 4.2 (calc) (ref <5.0)
Triglycerides: 152 mg/dL — ABNORMAL HIGH (ref <150)

## 2022-05-13 LAB — HEMOGLOBIN A1C
Hgb A1c MFr Bld: 5.3 % of total Hgb (ref <5.7)
Mean Plasma Glucose: 105 mg/dL
eAG (mmol/L): 5.8 mmol/L

## 2022-05-13 LAB — TSH: TSH: 1.06 mIU/L (ref 0.40–4.50)

## 2022-05-15 ENCOUNTER — Other Ambulatory Visit: Payer: Self-pay | Admitting: Medical-Surgical

## 2022-05-15 DIAGNOSIS — Z800 Family history of malignant neoplasm of digestive organs: Secondary | ICD-10-CM

## 2022-05-16 ENCOUNTER — Telehealth: Payer: Self-pay | Admitting: Genetic Counselor

## 2022-05-16 NOTE — Telephone Encounter (Signed)
Scheduled appt per 1/31 referral. Pt is aware of appt date and time. Pt is aware to arrive 15 mins prior to appt time and to bring and updated insurance card. Pt is aware of appt location.

## 2022-07-11 ENCOUNTER — Inpatient Hospital Stay: Payer: Managed Care, Other (non HMO)

## 2022-07-11 ENCOUNTER — Inpatient Hospital Stay: Payer: Managed Care, Other (non HMO) | Attending: Nurse Practitioner | Admitting: Genetic Counselor

## 2022-07-11 ENCOUNTER — Other Ambulatory Visit: Payer: Self-pay

## 2022-07-11 DIAGNOSIS — Z1379 Encounter for other screening for genetic and chromosomal anomalies: Secondary | ICD-10-CM

## 2022-07-11 DIAGNOSIS — Z1509 Genetic susceptibility to other malignant neoplasm: Secondary | ICD-10-CM

## 2022-07-11 NOTE — Progress Notes (Signed)
GENETIC TEST RESULTS   Patient Name: Nicholas Maynard Patient Age: 32 y.o. Encounter Date: 07/11/2022  Referring Provider: Christen Butter, NP   Nicholas Maynard was referred to [facility] given that his biological mother was recently diagnosed with Lynch syndrome (PMS2 gene mutation).  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    FAMILY HISTORY:  We obtained a detailed, 4-generation family history.  Significant diagnoses are listed below: Family History  Adopted: Yes      Mr. Stokely was adopted and has limited information about his biological parents.  His mother reported had positive genetic testing for PMS2.  No report was available for review today.   GENETIC TESTING: Nicholas Maynard tested positive for a single pathogenic variant in the PMS2 gene. 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  No other deleterious variants were detected in the Lynch syndrome Panel through [facility], which included analysis of MLH1, PMS2, EPCAM, MSH2, and MSH6.     Clinical Information Pathogenic variants in the PMS2 gene are associated with Lynch syndrome.  The cancers associated with PMS2 are: Colorectal cancer, 8.7-20% risk (average age of diagnosis is 48-66) Endometrial cancer, 13-26% risk (average age of diagnosis is 44-50) Ovarian cancer, up to a 3% risk (average age of diagnosis is 25-59) Renal pelvis and/or ureter, up to a 3.7% risk (average age of diagnosis is unknown) Prostate, pancreatic, gastric, small bowel, bladder, biliary tract, and brain cancer risk may be elevated---current evidence is not consistent with a significantly increased risk due to PMS2 mutation   Management Recommendations:  Colorectal Cancer Screening: High quality colonoscopy at age 74-35 or 2-5 years prior to the earliest colon cancer if it is diagnosed before age 12 and repeat every 1-3 years Studies have demonstrated that  the use of daily aspirin decreases the colorectal cancer risk in patients with Lynch Syndrome. Ongoing studies are investigating the optimal dose and duration of use of aspirin for colon cancer prevention in Lynch Syndrome. The decision to use aspirin should be made on an individualized basis including a discussion of dose, benefits, and adverse effects.    Endometrial Cancer Screening/Risk Reduction: Women should report any abnormal uterine bleeding or postmenopausal bleeding. The evaluation of these symptoms should include an endometrial biopsy. A hysterectomy may be considered. The timing should be individualized based on whether childbearing is complete, comorbidities, and family history. Endometrial cancer screening does not have a proven benefit in women with Lynch Syndrome. However, endometrial biopsy is highly sensitive and specific as a diagnostic procedure. Screening via endometrial biopsy every 1-2 years starting at age 4-35 can be considered. Transvaginal ultrasounds may be considered in postmenopausal women at their clinician's discretion.  Ovarian Cancer Screening/Risk Reduction: There is currently insufficient evidence to make a specific recommendation for a prophylactic bilateral salpingo-oophorectomy (BSO), or having the ovaries and fallopian tubes removed, for individuals who carry a PMS2 pathogenic variant.  PMS2 pathogenic variant carriers appear to be at no greater than average risk for ovarian cancer, and may consider deferring surveillance and may reasonably elect not to have an oophorectomy.  Current data does not support routine ovarian screening for Lynch syndrome.  If there is a family history of ovarian cancer, have a discussion with your physician about the benefits and limitations of screening and risk reduction strategies. Transvaginal ultrasound for ovarian cancer screening has not been shown to be sufficiently sensitive or specific as to support a routine recommendation,  but may be  considered at the clinician's discretion. Serum CA-125 is an additional ovarian screening test with caveats similar to transvaginal ultrasound Women should be aware of symptoms that might be associated with the development of ovarian cancer including pelvic or abdominal pain, bloating, increased abdominal girth, difficulty eating, early satiety, or urinary frequency or urgency. Symptoms that persist for several weeks and are a change from a woman's baseline should prompt evaluation by her physician.  Urothelial Cancer Screening: There is no clear evidence to support surveillance for urothelial cancer in Lynch syndrome.  Annual urinalysis starting at age 90-35 may be considered in selected individuals such as those with a family history of urothelial cancer (renal pelvis, ureter, and/or bladder).  Gastric and Small Bowel Cancer Screening: Consider upper GI surveillance with EGD starting at age 24-40 and repeat every 2-4 years, preferrably in conjuction with colonoscopy.  Individuals not undergoing upper endoscopic surveillance should have one-time non-invasive testing for H. pylori at the time of Lynch syndrome diagnosis, with treatment indicated if H. pylori is detected.    Pancreatic Cancer Screening: PMS2 carriers have not been shown to be at increased risk for pancreatic cancer.  Patients with a family history of pancreaitc cancer should receive care based on careful assessment and clinical judgement.   Prostate Cancer Screening: Consider beginning annual PSA blood test and digital rectal exams at age 64.  Brain Cancer Screening: Patients should be educated regarding signs and symptoms of neurologic cancer and the importance of prompt reporting of abnormal symptoms to their physicians.  Additional Considerations: Patients of reproductive age should be made aware of options for prenatal diagnosis and assisted reproduction including pre-implantation genetic diagnosis.  Individuals  with a single pathogenic PMS2 variant are also carriers of constitutional MMR deficiency (CMMRD) syndrome. CMMRD is a childhood-onset cancer predisposition syndrome that can present with hematological malignancies, cancers of the brain and central nervous system, Lynch syndrome-associated cancers (colon, uterine, small bowel, urinary tract), embryonic tumors, and sarcomas. Some affected individuals may also display cafe-au-lait macules. For there to be a risk of CMMRD in offspring, an individual and their partner would each have to have a single pathogenic variant in the same MMR gene; in such a case, the risk of having an affected child is 25%.   This information is based on current understanding of the gene and may change in the future.  Implications for Family Members: Hereditary predisposition to cancer due to pathogenic variants in the PMS2 gene has autosomal dominant inheritance. This means that an individual with a pathogenic variant has a 50% chance of passing the condition on to his/her offspring. Identification of a pathogenic variant allows for the recognition of at-risk relatives who can pursue testing for the familial variant.  Family members are encouraged to consider genetic testing for this familial pathogenic variant. As there are generally no childhood cancer risks associated with single pathogenic variants in the PSM2 gene, individuals in the family are not recommended to have testing until they reach at least 30 years of age. Family members who live outside of the area are encouraged to find a genetic counselor in their area by visiting: [employer]si.  Resources: FORCE (Facing Our Risk of Cancer Empowered) is a resource for those with a hereditary predisposition to develop cancer.  FORCE provides information about risk reduction, advocacy, legislation, and clinical trials.  Additionally, FORCE provides a platform for collaboration and support which  includes: peer navigation, message boards, local support groups, a toll-free helpline, research registry and recruitment, advocate training, published medical  research, webinars, brochures, mastectomy photos, and more.  For more information, visit www.facingourrisk.org  Colon Cancer Alliance for Research and Education for Lynch syndrome: www.fightlynch.org  Plan:  Mr. Eskra recently had a colonoscopy in [place] which showed two benign polyps.  No colonoscopy report was available for review today.  He expressed understanding of the recommendations to have colonoscopies at least every 1-3 years or as recommended by his GI providers.  He declined a referral to a GI office in [place] at this time but knows to reach out in the future if he has interested in being established locally for GI care.  We discussed that there are other genes that are known to be associated with cancer risk.  No report was available for his mother's testing, and it is unknown if she was previously tested for hereditary breast cancer genes.   Additional testing may be indicated if he learns about additional family history of cancer in his biological family or if he learns that his mother did not have comprehensive testing for her reported breast cancer diagnosis.  He is not interested in updated genetic testing at this time but knows to reach out in the future if interested/indicated.    Mr. Hornsby has our contact information and is welcome to contact us in the future with questions or updated on personal/family history.     Marvelle Caudill M. Rennie Plowman, MS, [facility] Genetic Counselor [email or web address] (P) [phone]

## 2022-09-10 ENCOUNTER — Encounter: Payer: Self-pay | Admitting: Genetic Counselor

## 2022-09-10 DIAGNOSIS — Z1509 Genetic susceptibility to other malignant neoplasm: Secondary | ICD-10-CM | POA: Insufficient documentation

## 2022-09-10 DIAGNOSIS — Z1379 Encounter for other screening for genetic and chromosomal anomalies: Secondary | ICD-10-CM | POA: Insufficient documentation

## 2023-10-22 NOTE — Patient Instructions (Signed)
 Preventive Care 76-31 Years Old, Male Preventive care refers to lifestyle choices and visits with your health care provider that can promote health and wellness. Preventive care visits are also called wellness exams. What can I expect for my preventive care visit? Counseling During your preventive care visit, your health care provider may ask about your: Medical history, including: Past medical problems. Family medical history. Current health, including: Emotional well-being. Home life and relationship well-being. Sexual activity. Lifestyle, including: Alcohol, nicotine or tobacco, and drug use. Access to firearms. Diet, exercise, and sleep habits. Safety issues such as seatbelt and bike helmet use. Sunscreen use. Work and work Astronomer. Physical exam Your health care provider may check your: Height and weight. These may be used to calculate your BMI (body mass index). BMI is a measurement that tells if you are at a healthy weight. Waist circumference. This measures the distance around your waistline. This measurement also tells if you are at a healthy weight and may help predict your risk of certain diseases, such as type 2 diabetes and high blood pressure. Heart rate and blood pressure. Body temperature. Skin for abnormal spots. What immunizations do I need?  Vaccines are usually given at various ages, according to a schedule. Your health care provider will recommend vaccines for you based on your age, medical history, and lifestyle or other factors, such as travel or where you work. What tests do I need? Screening Your health care provider may recommend screening tests for certain conditions. This may include: Lipid and cholesterol levels. Diabetes screening. This is done by checking your blood sugar (glucose) after you have not eaten for a while (fasting). Hepatitis B test. Hepatitis C test. HIV (human immunodeficiency virus) test. STI (sexually transmitted infection)  testing, if you are at risk. Talk with your health care provider about your test results, treatment options, and if necessary, the need for more tests. Follow these instructions at home: Eating and drinking  Eat a healthy diet that includes fresh fruits and vegetables, whole grains, lean protein, and low-fat dairy products. Drink enough fluid to keep your urine pale yellow. Take vitamin and mineral supplements as recommended by your health care provider. Do not drink alcohol if your health care provider tells you not to drink. If you drink alcohol: Limit how much you have to 0-2 drinks a day. Know how much alcohol is in your drink. In the [place] one drink equals one 12 oz bottle of beer (355 mL), one 5 oz glass of wine (148 mL), or one 1 oz glass of hard liquor (44 mL). Lifestyle Brush your teeth every morning and night with fluoride toothpaste. Floss one time each day. Exercise for at least 30 minutes 5 or more days each week. Do not use any products that contain nicotine or tobacco. These products include cigarettes, chewing tobacco, and vaping devices, such as e-cigarettes. If you need help quitting, ask your health care provider. Do not use drugs. If you are sexually active, practice safe sex. Use a condom or other form of protection to prevent STIs. Find healthy ways to manage stress, such as: Meditation, yoga, or listening to music. Journaling. Talking to a trusted person. Spending time with friends and family. Minimize exposure to UV radiation to reduce your risk of skin cancer. Safety Always wear your seat belt while driving or riding in a vehicle. Do not drive: If you have been drinking alcohol. Do not ride with someone who has been drinking. If you have been using any mind-altering substances  or drugs. While texting. When you are tired or distracted. Wear a helmet and other protective equipment during sports activities. If you have firearms in your house, make sure you  follow all gun safety procedures. Seek help if you have been physically or sexually abused. What's next? Go to your health care provider once a year for an annual wellness visit. Ask your health care provider how often you should have your eyes and teeth checked. Stay up to date on all vaccines. This information is not intended to replace advice given to you by your health care provider. Make sure you discuss any questions you have with your health care provider. Document Revised: 09/27/2020 Document Reviewed: 09/27/2020 Elsevier Patient Education  2024 [employer].

## 2023-10-22 NOTE — Progress Notes (Unsigned)
   Complete physical exam  Patient: Nicholas Maynard   DOB: December 22, 1990   32 y.o. Male  MRN: [id number]  Subjective:    No chief complaint on file.   Nicholas Maynard is a 33 y.o. male who presents today for a complete physical exam. He reports consuming a {diet types:17450} diet. {types:19826} He generally feels {DESC; WELL/FAIRLY WELL/POORLY:18703}. He reports sleeping {DESC; WELL/FAIRLY WELL/POORLY:18703}. He {does/does not:200015} have additional problems to discuss today.    Most recent fall risk assessment:    05/31/2021    2:33 PM  Fall Risk   Falls in the past year? 0  Number falls in past yr: 0  Injury with Fall? 0  Risk for fall due to : No Fall Risks  Follow up Falls evaluation completed      Data saved with a previous flowsheet row definition     Most recent depression screenings:    04/12/2022   10:17 AM 05/31/2021    2:33 PM  PHQ 2/9 Scores  PHQ - 2 Score 2 0  PHQ- 9 Score 7 3    {VISON DENTAL STD PSA (Optional):27386}  {History (Optional):23778}  Patient Care Team: Willo Mini, NP as PCP - General (Nurse Practitioner)   Outpatient Medications Prior to Visit  Medication Sig   lisinopril  (ZESTRIL ) 10 MG tablet Take 1 tablet (10 mg total) by mouth daily.   No facility-administered medications prior to visit.    ROS        Objective:     There were no vitals taken for this visit. {Vitals History (Optional):23777}  Physical Exam   No results found for any visits on 10/23/23. {Show previous labs (optional):23779}    Assessment & Plan:    Routine Health Maintenance and Physical Exam  Immunization History  Administered Date(s) Administered   Influenza-Unspecified 04/16/2019   Tdap 04/15/2013    Health Maintenance  Topic Date Due   Hepatitis B Vaccines (1 of 3 - 19+ 3-dose series) Never done   HPV VACCINES (1 - 3-dose SCDM series) Never done   COVID-19 Vaccine (1 - 2024-25 season) Never done   DTaP/Tdap/Td (2 - Td or Tdap) 04/16/2023    INFLUENZA VACCINE  11/14/2023   Meningococcal B Vaccine  Aged Out   Hepatitis C Screening  Discontinued   HIV Screening  Discontinued    Discussed health benefits of physical activity, and encouraged him to engage in regular exercise appropriate for his age and condition.  Problem List Items Addressed This Visit   None  No follow-ups on file.     Kwabena Strutz, NP

## 2023-10-23 ENCOUNTER — Ambulatory Visit (INDEPENDENT_AMBULATORY_CARE_PROVIDER_SITE_OTHER): Admitting: Medical-Surgical

## 2023-10-23 ENCOUNTER — Encounter: Payer: Self-pay | Admitting: Medical-Surgical

## 2023-10-23 VITALS — BP 146/100 | HR 77 | Ht 71.5 in | Wt 276.0 lb

## 2023-10-23 DIAGNOSIS — G479 Sleep disorder, unspecified: Secondary | ICD-10-CM

## 2023-10-23 DIAGNOSIS — F419 Anxiety disorder, unspecified: Secondary | ICD-10-CM | POA: Diagnosis not present

## 2023-10-23 DIAGNOSIS — K219 Gastro-esophageal reflux disease without esophagitis: Secondary | ICD-10-CM | POA: Diagnosis not present

## 2023-10-23 DIAGNOSIS — Z0000 Encounter for general adult medical examination without abnormal findings: Secondary | ICD-10-CM

## 2023-10-23 DIAGNOSIS — I10 Essential (primary) hypertension: Secondary | ICD-10-CM | POA: Diagnosis not present

## 2023-10-23 DIAGNOSIS — Z23 Encounter for immunization: Secondary | ICD-10-CM

## 2023-10-23 DIAGNOSIS — Z1509 Genetic susceptibility to other malignant neoplasm: Secondary | ICD-10-CM

## 2023-10-23 MED ORDER — PANTOPRAZOLE SODIUM 40 MG PO TBEC: 40.0000 mg | DELAYED_RELEASE_TABLET | Freq: Every day | ORAL | 3 refills | Status: AC

## 2023-10-23 MED ORDER — LISINOPRIL 10 MG PO TABS: 10.0000 mg | ORAL_TABLET | Freq: Every day | ORAL | 3 refills | Status: DC

## 2023-10-24 ENCOUNTER — Ambulatory Visit: Payer: Self-pay | Admitting: Medical-Surgical

## 2023-10-24 DIAGNOSIS — D508 Other iron deficiency anemias: Secondary | ICD-10-CM

## 2023-10-24 LAB — CMP14+EGFR
ALT: 33 IU/L (ref 0–44)
AST: 19 IU/L (ref 0–40)
Albumin: 4.3 g/dL (ref 4.3–5.2)
Alkaline Phosphatase: 92 IU/L (ref 44–121)
BUN/Creatinine Ratio: 9 (ref 9–20)
BUN: 9 mg/dL (ref 6–20)
Bilirubin Total: 0.4 mg/dL (ref 0.0–1.2)
CO2: 21 mmol/L (ref 20–29)
Calcium: 9.4 mg/dL (ref 8.7–10.2)
Chloride: 104 mmol/L (ref 96–106)
Creatinine, Ser: 0.98 mg/dL (ref 0.76–1.27)
Globulin, Total: 2.7 g/dL (ref 1.5–4.5)
Glucose: 94 mg/dL (ref 70–99)
Potassium: 4.3 mmol/L (ref 3.5–5.2)
Sodium: 139 mmol/L (ref 134–144)
Total Protein: 7 g/dL (ref 6.0–8.5)
eGFR: 106 mL/min/1.73 (ref >59)

## 2023-10-24 LAB — CBC WITH DIFFERENTIAL/PLATELET
Basophils Absolute: 0.1 x10E3/uL (ref 0.0–0.2)
Basos: 1 %
EOS (ABSOLUTE): 0.2 x10E3/uL (ref 0.0–0.4)
Eos: 3 %
Hematocrit: 51.2 % — ABNORMAL HIGH (ref 37.5–51.0)
Hemoglobin: 15.3 g/dL (ref 13.0–17.7)
Immature Grans (Abs): 0 x10E3/uL (ref 0.0–0.1)
Immature Granulocytes: 0 %
Lymphocytes Absolute: 2.5 x10E3/uL (ref 0.7–3.1)
Lymphs: 26 %
MCH: 25.5 pg — ABNORMAL LOW (ref 26.6–33.0)
MCHC: 29.9 g/dL — ABNORMAL LOW (ref 31.5–35.7)
MCV: 85 fL (ref 79–97)
Monocytes Absolute: 0.8 x10E3/uL (ref 0.1–0.9)
Monocytes: 8 %
Neutrophils Absolute: 6 x10E3/uL (ref 1.4–7.0)
Neutrophils: 62 %
Platelets: 365 x10E3/uL (ref 150–450)
RBC: 6 x10E6/uL — ABNORMAL HIGH (ref 4.14–5.80)
RDW: 13.6 % (ref 11.6–15.4)
WBC: 9.5 x10E3/uL (ref 3.4–10.8)

## 2023-10-24 LAB — LIPID PANEL
Chol/HDL Ratio: 3.7 ratio (ref 0.0–5.0)
Cholesterol, Total: 157 mg/dL (ref 100–199)
HDL: 43 mg/dL (ref >39)
LDL Chol Calc (NIH): 90 mg/dL (ref 0–99)
Triglycerides: 137 mg/dL (ref 0–149)
VLDL Cholesterol Cal: 24 mg/dL (ref 5–40)

## 2023-10-25 LAB — SPECIMEN STATUS REPORT

## 2023-11-06 ENCOUNTER — Ambulatory Visit (INDEPENDENT_AMBULATORY_CARE_PROVIDER_SITE_OTHER)

## 2023-11-06 VITALS — BP 133/92 | HR 81 | Ht 71.0 in | Wt 279.0 lb

## 2023-11-06 DIAGNOSIS — I10 Essential (primary) hypertension: Secondary | ICD-10-CM

## 2023-11-06 MED ORDER — AMLODIPINE BESYLATE 5 MG PO TABS: 5.0000 mg | ORAL_TABLET | Freq: Every day | ORAL | 0 refills | Status: DC

## 2023-11-06 NOTE — Progress Notes (Signed)
 Pt here for blood pressure check, pt states he is currently on lisinopril , pt states he has no skipped doses and is tolerating well, however upon doing some research on [employer]  he noticed that lisinopril  can  cause dry cough , pt states that he's noticed  he has a dry cough and feel like the medication could be the cause of it , despite the dry cough he continues to take the medication , pt''s first b/p in office is 143/97, after letting him sit for few minutes his second b/p was 133/92 ,pt denies blurred vision palpitations.    PCP advised pt to start amlodipine  5 mg daily, and follow a low sodium diet and start intentional exercise, follow up in 2 weeks for bp check.

## 2023-11-06 NOTE — Progress Notes (Signed)
 Medical screening examination/treatment was performed by qualified clinical staff member and as supervising provider I was immediately available for consultation/collaboration. I have reviewed documentation and agree with assessment and plan.  Thayer Ohm, DNP, APRN, FNP-BC [facility]

## 2023-11-07 LAB — IRON,TIBC AND FERRITIN PANEL
Ferritin: 128 ng/mL (ref 30–400)
Iron Saturation: 36 % (ref 15–55)
Iron: 113 ug/dL (ref 38–169)
Total Iron Binding Capacity: 313 ug/dL (ref 250–450)
UIBC: 200 ug/dL (ref 111–343)

## 2023-11-20 ENCOUNTER — Ambulatory Visit (INDEPENDENT_AMBULATORY_CARE_PROVIDER_SITE_OTHER)

## 2023-11-20 VITALS — BP 128/86 | HR 83 | Temp 97.6°F | Ht 71.0 in | Wt 282.1 lb

## 2023-11-20 DIAGNOSIS — I10 Essential (primary) hypertension: Secondary | ICD-10-CM

## 2023-11-20 NOTE — Progress Notes (Signed)
 Patient is here for blood pressure check. Denies trouble sleeping, palpitations, dizziness, lightheadedness, blurry vision, chest pain, shortness of breath, headaches and/or medication problems.   Patient's blood pressure was within goal 139/89, pulse 83. Patient sat for 15 minutes. Blood pressure recheck was at goal 128/86, pulse 83. Provider notified of current blood pressure reading.   Patient informed to schedule next appointment as directed by the provider.

## 2023-12-02 ENCOUNTER — Other Ambulatory Visit: Payer: Self-pay | Admitting: Medical-Surgical
